# Patient Record
Sex: Male | Born: 2000 | Race: Black or African American | Hispanic: No | Marital: Single | Smoking: Never smoker
Health system: Southern US, Community
[De-identification: ages and names within clinical notes are randomized; demographics above are authoritative.]

## PROBLEM LIST (undated history)

## (undated) VITALS — BP 111/69 | HR 85 | Temp 98.2°F | Resp 16 | Ht 69.0 in | Wt 133.0 lb

## (undated) DIAGNOSIS — J45909 Unspecified asthma, uncomplicated: Secondary | ICD-10-CM

## (undated) DIAGNOSIS — F419 Anxiety disorder, unspecified: Secondary | ICD-10-CM

## (undated) DIAGNOSIS — F319 Bipolar disorder, unspecified: Secondary | ICD-10-CM

---
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
## 2018-01-12 ENCOUNTER — Ambulatory Visit: Payer: Self-pay | Admitting: Psychiatry

## 2018-01-31 ENCOUNTER — Ambulatory Visit (INDEPENDENT_AMBULATORY_CARE_PROVIDER_SITE_OTHER): Payer: Self-pay | Admitting: Psychiatry

## 2018-01-31 ENCOUNTER — Encounter: Payer: Self-pay | Admitting: Psychiatry

## 2018-01-31 DIAGNOSIS — F902 Attention-deficit hyperactivity disorder, combined type: Secondary | ICD-10-CM

## 2018-01-31 NOTE — Progress Notes (Signed)
      [facility] Counselor/Therapist Progress Note  Patient ID: Kevin Shepherd, MRN: [id number],    Date: 01/31/2018  Time Spent: 48 minutes   Treatment Type: Individual Therapy  Reported Symptoms: Anxious Mood,sadness over the past month, impulsive choice  Mental Status Exam:  Appearance:   Casual     Behavior:  Appropriate  Motor:  Normal  Speech/Language:   Normal Rate  Affect:  Appropriate  Mood:  normal  Thought process:  circumstantial  Thought content:    WNL  Sensory/Perceptual disturbances:    WNL  Orientation:  oriented to person, place and situation  Attention:  Good  Concentration:  Good  Memory:  WNL  Fund of knowledge:   Good  Insight:    Good  Judgment:   Good  Impulse Control:  Good   Risk Assessment: Danger to Self:  No Self-injurious Behavior: No Danger to Others: No Duty to Warn:no Physical Aggression / Violence:No  Access to Firearms a concern: No  Gang Involvement:No   Subjective: Patient was present for session.  He reported he was fired from his job after an impulsive decision to help someone.  He went on to report that he had a few weeks of depression but he than used his self talk to get himself going.  He has found another job and is excited about the new option.  Pt went on to explain that he is trying to remind himself that even when things look bad in the moment they can still be okay.  Discussed the importance of perceptions and using his CBT skills to remind himself of what he needs to focus on on a regular basis.  Also discussed skills to think through choices and the outcomes of those choices before he makes decisions.  Patient was able to report some positive choices he has made recently and ways he is taking better care of himself and moving in a positive direction.  Agreed to have patient return in 6-8 weeks due to the progress and starting a new position.  Interventions: Solution-Oriented/Positive Psychology  Diagnosis:   ICD-10-CM   1.  Attention deficit hyperactivity disorder (ADHD), combined type F90.2     Plan: 1.  Patient to continue to engage in individual counseling 2-4 times a month or as needed. 2.  Patient to identify and apply CBT, coping skills learned in session to decrease impulsive behaviors. 3.  Patient to contact this office, go to the local ED or call 911 if a crisis or emergency develops between visits.  Stevphen MeuseHolly Dawsen Krieger, [place]PC

## 2018-03-16 ENCOUNTER — Ambulatory Visit: Payer: Self-pay | Admitting: Psychiatry

## 2018-04-02 ENCOUNTER — Ambulatory Visit: Payer: Self-pay | Admitting: Psychiatry

## 2019-05-27 ENCOUNTER — Other Ambulatory Visit: Payer: Self-pay

## 2019-05-27 ENCOUNTER — Encounter: Payer: Self-pay | Admitting: Psychiatry

## 2019-05-27 ENCOUNTER — Ambulatory Visit (INDEPENDENT_AMBULATORY_CARE_PROVIDER_SITE_OTHER): Payer: BC Managed Care – PPO | Admitting: Psychiatry

## 2019-05-27 VITALS — Ht 68.0 in | Wt 141.0 lb

## 2019-05-27 DIAGNOSIS — F411 Generalized anxiety disorder: Secondary | ICD-10-CM | POA: Diagnosis not present

## 2019-05-27 DIAGNOSIS — F341 Dysthymic disorder: Secondary | ICD-10-CM

## 2019-05-27 DIAGNOSIS — F902 Attention-deficit hyperactivity disorder, combined type: Secondary | ICD-10-CM | POA: Diagnosis not present

## 2019-05-27 MED ORDER — DESVENLAFAXINE SUCCINATE ER 25 MG PO TB24
25.0000 mg | ORAL_TABLET | Freq: Every day | ORAL | 2 refills | Status: DC
Start: 2019-05-27 — End: 2019-06-16

## 2019-05-27 NOTE — Progress Notes (Signed)
[facility] Med Check  Patient ID: Kevin Shepherd,  MRN: [id number]  PCP: Patient, No Pcp Per  Date of Evaluation: 05/27/2019 Time spent:20 minutes from 1120 to 1140  Chief Complaint:  Chief Complaint    Depression; ADHD; Anxiety      HISTORY/CURRENT STATUS: Kevin Shepherd is seen onsite in office individually face-to-face and then conjointly in the lobby with grandmother who he then directs to talk to me alone after first forbidding such for total of 20 minutes with lability of consent with epic collateral for psychiatric interview and exam in 42-month evaluation and management of dysthymia, anxiety, and ADHD defended with cluster B traits.  The family requires patient to restart antidepressant antianxiety medication again after having renewed Cymbalta and trazodone at last appointment which was 15 months after the preceding appointment.  He had taken Lamictal, Depakote, and Intuniv from Dr. Tomasa Shepherd for 6 years prior to being seen the couple of times by myself after Dr. Tomasa Shepherd departed the practice, stepfather also being seen by myself while mother elsewhere in the office.  Family changes in employment and residence over time become highin expressed emotion, patient summarizing today that he has his own apartment and ability to work and needs no assistance or assessment for wellbeing or efficacy.  In reviewing past treatment targets and attempting to integrate with interim relationship and environmental factors, the patient dismisses the past history of maternal grandfather having OCD, maternal uncle having schizophrenia, and father having cannabis with anger problems.  He particularly feels these are family conflicts about history especially about his father seeming to disapprove of his stepfather and mother now.  Grandmother suggests she is caught in the middle but that Kevin Shepherd no longer resides with her.  However, she suggests that he makes depressive and anxious statements that concern her as he is  angry about daily life and what will happen next.  She currently considers these threatening statements but is not more specific stating that she understands Kevin Shepherd is private about these affairs, and he instructs me that he will discuss these in therapy but not with me.  He does seem to have some comfort in his psychotherapy with Kevin Shepherd last being seen in November 2019 around the ADHD not returning for follow-up in 2 weeks.  Weight is stable and there are no general medical concerns now including GI symptoms of the past.  Patient doubts he needs any medication while grandmother considers that he must have medication to relieve the anxiety and depression and be favorable to his focus.  They conclude that I send this to the pharmacy at [facility] [address] but patient is doubtful he will take it but he does seem to ultimately respect and listen to grandmother in the lobby.  He seems angry about past conclusions of the family at large with blended family conflicts.  He does not tolerate change and does not acknowledge the status of his cannabis.  He does not acknowledge mania, suicidality, psychosis, or delirium.  Depression      The patient presents with depression.  This is a chronic problem.  The current episode started more than 1 year ago.   The onset quality is gradual.   The problem occurs daily.  The problem has been waxing and waning since onset.  Associated symptoms include decreased concentration, hopelessness, irritable, restlessness, decreased interest and sad.  Associated symptoms include no fatigue, no helplessness, does not have insomnia, no appetite change and no indigestion.     The symptoms are aggravated by  work stress, social issues and family issues.  Past treatments include SNRIs - Serotonin and norepinephrine reuptake inhibitors, other medications and psychotherapy.  Compliance with treatment is poor.  Past compliance problems include difficulty with treatment plan and medication  issues.  Previous treatment provided mild relief.  Risk factors include substance abuse, stress, major life event, history of mental illness, family history of mental illness, family history and a change in medication usage/dosage.   Past medical history includes anxiety, depression and mental health disorder.     Pertinent negatives include no life-threatening condition, no physical disability, no recent psychiatric admission, no eating disorder and no obsessive-compulsive disorder.   Individual Medical History/ Review of Systems: Changes? :No   Allergies: Patient has no known allergies.  Current Medications:  Current Outpatient Medications:  .  benzonatate (TESSALON) 100 MG capsule, Take 1 capsule (100 mg total) by mouth every 8 (eight) hours., Disp: 21 capsule, Rfl: 0 .  desvenlafaxine 25 MG TB24, Take 25 mg by mouth daily after breakfast., Disp: 30 tablet, Rfl: 2 .  ibuprofen (ADVIL,MOTRIN) 800 MG tablet, Take 1 tablet (800 mg total) by mouth 3 (three) times daily., Disp: 21 tablet, Rfl: 0 .  ondansetron (ZOFRAN ODT) 4 MG disintegrating tablet, Take 1 tablet (4 mg total) by mouth every 8 (eight) hours as needed for nausea., Disp: 10 tablet, Rfl: 0  Medication Side Effects: none  Family Medical/ Social History: Changes? No  MENTAL HEALTH EXAM:  Height 5\' 8"  (1.727 m), weight 141 lb (64 kg).Body mass index is 21.44 kg/m. Muscle strengths and tone 5/5, postural reflexes and gait 0/0, and AIMS = 0 otherwise deferred for coronavirus shutdown  General Appearance: Casual, Fairly Groomed, Guarded and Meticulous  Eye Contact:  Fair  Speech:  Blocked, Clear and Coherent, Normal Rate and Talkative  Volume:  Normal  Mood:  Angry, Anxious, Depressed, Dysphoric and Irritable  Affect:  Depressed, Inappropriate, Labile, Restricted and Anxious  Thought Process:  Coherent, Goal Directed, Irrelevant, Linear and Descriptions of Associations: Tangential  Orientation:  Full (Time, Place, and Person)   Thought Content: Rumination and Tangential   Suicidal Thoughts:  No grandmother reporting patient makes angry threatening statements not being more specific  Homicidal Thoughts:  No grandmother reporting patient makes angry threatening statements not being more specific  Memory:  Immediate;   Good and Fair Remote;   Good and Fair  Judgement:  Fair to impaired  Insight:  Fair and Lacking  Psychomotor Activity:  Normal, Increased, Mannerisms and Restlessness  Concentration:  Concentration: Fair and Attention Span: Fair  Recall:  [employer] of Knowledge: Fair  Language: Good  Assets:  Housing Resilience Talents/Skills  ADL's:  Intact  Cognition: WNL  Prognosis:  Fair    DIAGNOSES:    ICD-10-CM   1. Persistent depressive disorder with atypical features, currently moderate  F34.1 desvenlafaxine 25 MG TB24  2. Attention deficit hyperactivity disorder (ADHD), combined type, mild  F90.2 desvenlafaxine 25 MG TB24  3. Generalized anxiety disorder  F41.1 desvenlafaxine 25 MG TB24    Receiving Psychotherapy: Yes Patient states he is seeing Lina Sayre, [facility] today and that family required him to see me at the same time because of his past treatment needs and current status   RECOMMENDATIONS: The patient dismisses restarting any of his past medication despite his anger over her current relations and communication, as he expresses his own commitment to resumption of consistent appropriate therapy participation.  We discussed facilitation of his participation for consistency, focus, and  impulse and emotion regulation with Pristiq as an alternative to previous medications.  He instructs that I am not include grandmother initially and then when they call me to the lobby instructs that I do speak to her alone which time grandmother focuses simply upon getting a medication for the patient's depression and anxiety which she describes as the source of his worsening of focus communication and problem  solving for current conflicts.  He is E scribed Pristiq 25 mg every morning after breakfast sent as #30 with [facility] [address] reviewing with patient and then mother the medication for his past experience and knowledge about his medications and problems.  Recommendation for return in 3 months or sooner if willing can be integrated as he prepares to see Kevin Shepherd, [facility] shortly thereafter this morning to which he seems to look forward as though he is more comfortable discussing his problems with her.   Chauncey Mann, MD

## 2019-05-28 ENCOUNTER — Ambulatory Visit (INDEPENDENT_AMBULATORY_CARE_PROVIDER_SITE_OTHER): Payer: BC Managed Care – PPO | Admitting: Psychiatry

## 2019-05-28 DIAGNOSIS — F341 Dysthymic disorder: Secondary | ICD-10-CM | POA: Diagnosis not present
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
## 2019-06-12 ENCOUNTER — Encounter (HOSPITAL_COMMUNITY): Payer: Self-pay | Admitting: Psychiatric/Mental Health

## 2019-06-12 ENCOUNTER — Inpatient Hospital Stay (HOSPITAL_COMMUNITY)
Admission: AD | Admit: 2019-06-12 | Discharge: 2019-06-16 | DRG: 885 | Disposition: A | Discharge status: Home / Self Care | Payer: BC Managed Care – PPO | Attending: Emergency Medicine | Admitting: Emergency Medicine

## 2019-06-12 ENCOUNTER — Other Ambulatory Visit: Payer: Self-pay

## 2019-06-12 DIAGNOSIS — F319 Bipolar disorder, unspecified: Secondary | ICD-10-CM

## 2019-06-12 DIAGNOSIS — Z818 Family history of other mental and behavioral disorders: Secondary | ICD-10-CM

## 2019-06-12 DIAGNOSIS — S62306A Unspecified fracture of fifth metacarpal bone, right hand, initial encounter for closed fracture: Secondary | ICD-10-CM

## 2019-06-12 DIAGNOSIS — Z20822 Contact with and (suspected) exposure to covid-19: Secondary | ICD-10-CM | POA: Diagnosis present

## 2019-06-12 DIAGNOSIS — F3164 Bipolar disorder, current episode mixed, severe, with psychotic features: Principal | ICD-10-CM | POA: Diagnosis present

## 2019-06-12 DIAGNOSIS — S62308A Unspecified fracture of other metacarpal bone, initial encounter for closed fracture: Secondary | ICD-10-CM | POA: Diagnosis present

## 2019-06-12 DIAGNOSIS — T1490XA Injury, unspecified, initial encounter: Secondary | ICD-10-CM | POA: Diagnosis not present

## 2019-06-12 DIAGNOSIS — R45851 Suicidal ideations: Secondary | ICD-10-CM | POA: Diagnosis present

## 2019-06-12 DIAGNOSIS — Z9114 Patient's other noncompliance with medication regimen: Secondary | ICD-10-CM

## 2019-06-12 DIAGNOSIS — W2201XA Walked into wall, initial encounter: Secondary | ICD-10-CM | POA: Diagnosis present

## 2019-06-12 DIAGNOSIS — F1290 Cannabis use, unspecified, uncomplicated: Secondary | ICD-10-CM | POA: Diagnosis present

## 2019-06-12 LAB — RESPIRATORY PANEL BY RT PCR (FLU A&B, COVID)
Influenza A by PCR: NEGATIVE
Influenza B by PCR: NEGATIVE
SARS Coronavirus 2 by RT PCR: NEGATIVE

## 2019-06-12 MED ORDER — ALUM & MAG HYDROXIDE-SIMETH 200-200-20 MG/5ML PO SUSP
30.0000 mL | ORAL | Status: DC | PRN
Start: 2019-06-12 — End: 2019-06-16

## 2019-06-12 MED ORDER — OLANZAPINE 5 MG PO TBDP: 5.0000 mg | ORAL_TABLET | Freq: Three times a day (TID) | ORAL | Status: DC | PRN

## 2019-06-12 MED ORDER — LORAZEPAM 1 MG PO TABS: 1.0000 mg | ORAL_TABLET | ORAL | Status: DC | PRN

## 2019-06-12 MED ORDER — HYDROXYZINE HCL 25 MG PO TABS: 25.0000 mg | ORAL_TABLET | Freq: Three times a day (TID) | ORAL | Status: DC | PRN

## 2019-06-12 MED ORDER — MAGNESIUM HYDROXIDE 400 MG/5ML PO SUSP
30.0000 mL | Freq: Every day | ORAL | Status: DC | PRN
Start: 2019-06-12 — End: 2019-06-16

## 2019-06-12 MED ORDER — ZIPRASIDONE MESYLATE 20 MG IM SOLR: 20.0000 mg | INTRAMUSCULAR | Status: DC | PRN

## 2019-06-12 MED ORDER — LORAZEPAM 2 MG/ML IJ SOLN
INTRAMUSCULAR | Status: AC
  Filled 2019-06-12: qty 1

## 2019-06-12 MED ORDER — TRAZODONE HCL 50 MG PO TABS: 50.0000 mg | ORAL_TABLET | Freq: Every evening | ORAL | Status: DC | PRN

## 2019-06-12 MED ORDER — LORAZEPAM 2 MG/ML IJ SOLN: 1.0000 mg | Freq: Four times a day (QID) | INTRAMUSCULAR | Status: DC | PRN

## 2019-06-12 MED ORDER — LORAZEPAM 1 MG PO TABS: 1.0000 mg | ORAL_TABLET | Freq: Four times a day (QID) | ORAL | Status: DC | PRN

## 2019-06-12 MED ORDER — ZIPRASIDONE MESYLATE 20 MG IM SOLR
INTRAMUSCULAR | Status: AC
  Filled 2019-06-12: qty 20

## 2019-06-12 MED ORDER — ACETAMINOPHEN 325 MG PO TABS
650.0000 mg | ORAL_TABLET | Freq: Four times a day (QID) | ORAL | Status: DC | PRN
  Administered 2019-06-14 – 2019-06-15 (×2): 650 mg via ORAL
  Filled 2019-06-12 (×2): qty 2
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
## 2019-06-12 NOTE — Progress Notes (Signed)
D:  Patient presents with increased agitation due to being IVC'ed by family. Pt states, "I hate my mother and my step-father. I don't want her knowing anything about my care here. I'll let my father know". He reports "fair" appetite, "poor" sleep , and denies any physical complaints when asked. He denies any SI, HI, AVH when asked. Compliant with COVID testing after encouragement. Results pending. No aggressive behaviors noted. Denies SI and passive HI toward mother and step-father. Pt states, "If they come near me, ain't no telling what I might do". Patient easily agitated.    A: Support and encouragement provided. Routine safety checks conducted every 15 minutes per unit protocol. Encouraged to notify if thoughts of harm toward self or others arise. He agrees.   R: Patient remains safe at this time; ate meal without difficulty; patient verbally contracts for safety at this time. Will continue to monitor.
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
## 2019-06-12 NOTE — Progress Notes (Signed)
  D: Patient lying in bed at this time. Denies having any issues. Some irritation noted from patient when MHT does q 15 minute checks. Spoke with him and told him that we have to do this and offered him something for sleep if needed. He reports "I'm okay".  A: Staff will continue to monitor on q 15 minute checks and follow treatment plan as needed. R: Continues to stay in bed.

## 2019-06-13 ENCOUNTER — Telehealth: Payer: Self-pay | Admitting: Psychiatry

## 2019-06-13 DIAGNOSIS — R45851 Suicidal ideations: Secondary | ICD-10-CM | POA: Diagnosis present

## 2019-06-13 DIAGNOSIS — W2201XA Walked into wall, initial encounter: Secondary | ICD-10-CM | POA: Diagnosis present

## 2019-06-13 DIAGNOSIS — F1290 Cannabis use, unspecified, uncomplicated: Secondary | ICD-10-CM | POA: Diagnosis present

## 2019-06-13 DIAGNOSIS — F3164 Bipolar disorder, current episode mixed, severe, with psychotic features: Secondary | ICD-10-CM | POA: Diagnosis present

## 2019-06-13 DIAGNOSIS — S62308A Unspecified fracture of other metacarpal bone, initial encounter for closed fracture: Secondary | ICD-10-CM | POA: Diagnosis present

## 2019-06-13 DIAGNOSIS — Z818 Family history of other mental and behavioral disorders: Secondary | ICD-10-CM | POA: Diagnosis not present

## 2019-06-13 DIAGNOSIS — T1490XA Injury, unspecified, initial encounter: Secondary | ICD-10-CM | POA: Diagnosis present

## 2019-06-13 DIAGNOSIS — Z20822 Contact with and (suspected) exposure to covid-19: Secondary | ICD-10-CM | POA: Diagnosis present

## 2019-06-13 DIAGNOSIS — Z9114 Patient's other noncompliance with medication regimen: Secondary | ICD-10-CM | POA: Diagnosis not present

## 2019-06-13 LAB — COMPREHENSIVE METABOLIC PANEL
ALT: 46 U/L — ABNORMAL HIGH (ref 0–44)
AST: 33 U/L (ref 15–41)
Albumin: 4.7 g/dL (ref 3.5–5.0)
Alkaline Phosphatase: 80 U/L (ref 38–126)
Anion gap: 10 (ref 5–15)
BUN: 11 mg/dL (ref 6–20)
CO2: 26 mmol/L (ref 22–32)
Calcium: 9.6 mg/dL (ref 8.9–10.3)
Chloride: 102 mmol/L (ref 98–111)
Creatinine, Ser: 0.9 mg/dL (ref 0.61–1.24)
GFR calc Af Amer: >60 mL/min (ref >60)
GFR calc non Af Amer: >60 mL/min (ref >60)
Glucose, Bld: 94 mg/dL (ref 70–99)
Potassium: 3.6 mmol/L (ref 3.5–5.1)
Sodium: 138 mmol/L (ref 135–145)
Total Bilirubin: 1 mg/dL (ref 0.3–1.2)
Total Protein: 8.3 g/dL — ABNORMAL HIGH (ref 6.5–8.1)

## 2019-06-13 LAB — ETHANOL: Alcohol, Ethyl (B): <10 mg/dL (ref <10)

## 2019-06-13 LAB — URINALYSIS, ROUTINE W REFLEX MICROSCOPIC
Bilirubin Urine: NEGATIVE
Glucose, UA: NEGATIVE mg/dL
Hgb urine dipstick: NEGATIVE
Ketones, ur: 20 mg/dL — AB
Leukocytes,Ua: NEGATIVE
Nitrite: NEGATIVE
Protein, ur: 30 mg/dL — AB
Specific Gravity, Urine: 1.028 (ref 1.005–1.030)
pH: 5 (ref 5.0–8.0)

## 2019-06-13 LAB — CBC
HCT: 47.9 % (ref 39.0–52.0)
Hemoglobin: 15.4 g/dL (ref 13.0–17.0)
MCH: 28.1 pg (ref 26.0–34.0)
MCHC: 32.2 g/dL (ref 30.0–36.0)
MCV: 87.4 fL (ref 80.0–100.0)
Platelets: 281 10*3/uL (ref 150–400)
RBC: 5.48 MIL/uL (ref 4.22–5.81)
RDW: 13.7 % (ref 11.5–15.5)
WBC: 4.9 10*3/uL (ref 4.0–10.5)
nRBC: 0 % (ref 0.0–0.2)

## 2019-06-13 LAB — LIPID PANEL
Cholesterol: 188 mg/dL (ref 0–200)
HDL: 57 mg/dL (ref >40)
LDL Cholesterol: 123 mg/dL — ABNORMAL HIGH (ref 0–99)
Total CHOL/HDL Ratio: 3.3 RATIO
Triglycerides: 40 mg/dL (ref <150)
VLDL: 8 mg/dL (ref 0–40)

## 2019-06-13 LAB — RAPID URINE DRUG SCREEN, HOSP PERFORMED
Amphetamines: NOT DETECTED
Barbiturates: NOT DETECTED
Benzodiazepines: NOT DETECTED
Cocaine: NOT DETECTED
Opiates: NOT DETECTED
Tetrahydrocannabinol: POSITIVE — AB

## 2019-06-13 LAB — TSH: TSH: 1.403 u[IU]/mL (ref 0.350–4.500)

## 2019-06-13 MED ORDER — TRAZODONE HCL 100 MG PO TABS
200.0000 mg | ORAL_TABLET | Freq: Every day | ORAL | Status: DC
  Filled 2019-06-13 (×6): qty 2

## 2019-06-13 MED ORDER — OLANZAPINE 10 MG PO TBDP
10.0000 mg | ORAL_TABLET | Freq: Every day | ORAL | Status: DC
  Filled 2019-06-13 (×4): qty 1

## 2019-06-13 MED ORDER — OXCARBAZEPINE 150 MG PO TABS
75.0000 mg | ORAL_TABLET | Freq: Two times a day (BID) | ORAL | Status: DC
  Administered 2019-06-15 – 2019-06-16 (×2): 75 mg via ORAL
  Filled 2019-06-13 (×10): qty 0.5

## 2019-06-13 MED ORDER — VENLAFAXINE HCL ER 75 MG PO CP24
75.0000 mg | ORAL_CAPSULE | Freq: Every day | ORAL | Status: DC
  Filled 2019-06-13 (×2): qty 1

## 2019-06-13 MED ORDER — OLANZAPINE 10 MG PO TBDP
10.0000 mg | ORAL_TABLET | Freq: Two times a day (BID) | ORAL | Status: DC
  Filled 2019-06-13 (×2): qty 1
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
## 2019-06-13 NOTE — Progress Notes (Signed)
[facility] Group Notes:  (Nursing/MHT/Case Management/Adjunct)  Date:  06/13/2019  Time:  2030  Type of Therapy:  wrap up group  Participation Level:  Active  Participation Quality:  Appropriate, Attentive, Sharing and Supportive  Affect:  Anxious  Cognitive:  Alert  Insight:  Improving  Engagement in Group:  Engaged  Modes of Intervention:  Clarification, Education and Support  Summary of Progress/Problems: Positive thinking and positive change were discussed. Pt reported learning "true colors" of some people in his life and wants to be more independent by earning more money. Pt is grateful for his dog.   Marcille Buffy 06/13/2019, 9:01 PM

## 2019-06-13 NOTE — BH Assessment (Signed)
BHH Assessment Progress Note  Per Malvin Johns, MD, this pt requires psychiatric hospitalization.  Jasmone has assigned pt to [facility] Rm 300-2.  Pt presents under IVC initiated by pt's mother, and upheld by Dr Jeannine Kitten.  IVC documents may be found on pt's chart.  Pt's nurse has been notified.   Doylene Canning, [place] Behavioral Health Coordinator [phone]

## 2019-06-13 NOTE — Progress Notes (Signed)
D:  Patient presents agitated this morning, requesting to leave. Denies need for inpatient stay. Pt continue blaming mother and Aunt for reason for being here. Pt states, "My Aunt stay in [place]. She don't know what's really going on down here. My mother know but staying quiet about it. That's why I don't want her in my life anymore. Patient was agitated, but cooperated, when staff transported him to [facility] for continued treatment. Denies any SI, HI, AVH when asked.    A: Support and encouragement provided. Routine safety checks conducted every 15 minutes per unit protocol. Encouraged to notify staff if thoughts of harm toward self or others arise. He agrees.   R: Patient remains safe at this time, patient verbally contracts for safety at this time. Will continue to monitor.   [place] NOVEL CORONAVIRUS (COVID-19) DAILY CHECK-OFF SYMPTOMS - answer yes or no to each - every day NO YES  Have you had a fever in the past 24 hours?  . Fever (Temp > 37.80C / 100F) X   Have you had any of these symptoms in the past 24 hours? . New Cough .  Sore Throat  .  Shortness of Breath .  Difficulty Breathing .  Unexplained Body Aches   X   Have you had any one of these symptoms in the past 24 hours not related to allergies?   . Runny Nose .  Nasal Congestion .  Sneezing   X   If you have had runny nose, nasal congestion, sneezing in the past 24 hours, has it worsened?  X   EXPOSURES - check yes or no X   Have you traveled outside the state in the past 14 days?  X   Have you been in contact with someone with a confirmed diagnosis of COVID-19 or PUI in the past 14 days without wearing appropriate PPE?  X   Have you been living in the same home as a person with confirmed diagnosis of COVID-19 or a PUI (household contact)?    X   Have you been diagnosed with COVID-19?    X              What to do next: Answered NO to all: Answered YES to anything:   Proceed with unit schedule Follow the [facility]  Inpatient Flowsheet.
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
## 2019-06-13 NOTE — Progress Notes (Signed)
Pt very irritable and argumentative on approach, pt was observed having a wrist brace on , so and order was put in. Pt did not want to talk to Clinical research associate.    06/13/19 2000  Psych Admission Type (Psych Patients Only)  Admission Status Involuntary  Psychosocial Assessment  Patient Complaints Anxiety;Agitation  Eye Contact Brief  Facial Expression Flat  Affect Constricted  Speech Unremarkable  Interaction Guarded  Motor Activity Fidgety  Appearance/Hygiene Body odor  Behavior Characteristics Anxious;Agitated;Agressive verbally  Mood Labile;Suspicious;Irritable  Thought Process  Coherency Disorganized  Content Blaming others;Paranoia  Delusions Paranoid  Perception WDL  Hallucination None reported or observed  Judgment Limited  Confusion Mild  Danger to Self  Current suicidal ideation? Denies  Danger to Others  Danger to Others Reported or observed  Danger to Others Abnormal  Harmful Behavior to others No threats or harm toward other people  Destructive Behavior No threats or harm toward property

## 2019-06-13 NOTE — Tx Team (Signed)
Initial Treatment Plan 06/13/2019 3:52 PM Kevin Shepherd LGS:[id number]    PATIENT STRESSORS: Financial difficulties Health problems Medication change or noncompliance   PATIENT STRENGTHS: Average or above average intelligence Supportive family/friends   PATIENT IDENTIFIED PROBLEMS: Medication noncompliance  Anxiety  Psychosis                 DISCHARGE CRITERIA:  Adequate post-discharge living arrangements Safe-care adequate arrangements made  PRELIMINARY DISCHARGE PLAN: Attend aftercare/continuing care group Outpatient therapy Return to previous living arrangement  PATIENT/FAMILY INVOLVEMENT: This treatment plan has been presented to and reviewed with the patient, Kevin Shepherd, and/or family member.  The patient and family have been given the opportunity to ask questions and make suggestions.  Clarene Critchley, RN 06/13/2019, 3:52 PM

## 2019-06-14 ENCOUNTER — Inpatient Hospital Stay (HOSPITAL_COMMUNITY)
Admit: 2019-06-14 | Discharge: 2019-06-14 | Disposition: A | Discharge status: Home / Self Care | Payer: BC Managed Care – PPO | Attending: Psychiatry | Admitting: Psychiatry

## 2019-06-14 DIAGNOSIS — F3164 Bipolar disorder, current episode mixed, severe, with psychotic features: Principal | ICD-10-CM

## 2019-06-14 LAB — PROLACTIN: Prolactin: 15.4 ng/mL — ABNORMAL HIGH (ref 4.0–15.2)

## 2019-06-14 LAB — HEMOGLOBIN A1C
Hgb A1c MFr Bld: 5.2 % (ref 4.8–5.6)
Mean Plasma Glucose: 103 mg/dL

## 2019-06-14 MED ORDER — TRAMADOL HCL 50 MG PO TABS: 50.0000 mg | ORAL_TABLET | Freq: Four times a day (QID) | ORAL | Status: DC | PRN

## 2019-06-14 MED ORDER — TETANUS-DIPHTH-ACELL PERTUSSIS 5-2.5-18.5 LF-MCG/0.5 IM SUSP
0.5000 mL | Freq: Once | INTRAMUSCULAR | Status: DC
  Filled 2019-06-14: qty 0.5

## 2019-06-14 NOTE — Progress Notes (Signed)
Orthopedic Tech Progress Note Patient Details:  Kevin Shepherd 2000/09/14 [id number]  Ortho Devices Type of Ortho Device: Ulna gutter splint, Ace wrap Ortho Device/Splint Location: Right Wrist Ortho Device/Splint Interventions: Ordered, Application   Post Interventions Patient Tolerated: Well Instructions Provided: Adjustment of device, Care of device   Kevin Shepherd 06/14/2019, 2:10 PM

## 2019-06-14 NOTE — Progress Notes (Signed)
[facility] MD Progress Note  06/14/2019 9:54 AM Kevin Shepherd  MRN:  [id number] Subjective: Patient is a 19 year old male with a reported history of bipolar disorder who was placed under involuntary commitment by his mother.  Per the IVC paperwork the patient had not been eating, not sleeping or attending to his hygiene.  He also had apparently become paranoid and agitated to the point of becoming violent.  Objective: Patient is seen and examined.  Patient is a 19 year old male with the above-stated past psychiatric history who is seen in follow-up.  He is much calmer today.  He stated he is "just chilling out".  His main concern this morning is right hand pain.  He apparently hit a wall during the event that took place that led to his involuntary commitment.  He stated that he is having pain as well as swelling in his hands.  He does have some form of hand brace on.  He denied auditory or visual hallucinations.  He denied suicidal or homicidal ideation.  He did not take any medication at all yesterday, he refused what was offered.  He did sleep approximately 5-1/2 hours last night, and his vital signs are stable.  Review of his laboratories revealed a mildly elevated ALT at 46 but otherwise normal electrolytes.  His lipid panel was essentially normal.  His CBC was essentially normal.  Hemoglobin A1c was normal, TSH was normal.  Drug screen was positive for marijuana.  He admitted that he uses marijuana every day, and we discussed the dangers of marijuana with regard to psychosis.  Principal Problem: Bipolar disorder (HCC) Diagnosis: Principal Problem:   Bipolar disorder (HCC)  Total Time spent with patient: 20 minutes  Past Psychiatric History: See admission H&P  Past Medical History: History reviewed. No pertinent past medical history. History reviewed. No pertinent surgical history. Family History: History reviewed. No pertinent family history. Family Psychiatric  History: See admission H&P Social History:   Social History   Substance and Sexual Activity  Alcohol Use No     Social History   Substance and Sexual Activity  Drug Use Not Currently  . Types: Marijuana    Social History   Socioeconomic History  . Marital status: Single    Spouse name: Not on file  . Number of children: Not on file  . Years of education: Not on file  . Highest education level: Not on file  Occupational History  . Not on file  Tobacco Use  . Smoking status: Never Smoker  . Smokeless tobacco: Never Used  Substance and Sexual Activity  . Alcohol use: No  . Drug use: Not Currently    Types: Marijuana  . Sexual activity: Not on file  Other Topics Concern  . Not on file  Social History Narrative  . Not on file   Social Determinants of Health   Financial Resource Strain:   . Difficulty of Paying Living Expenses:   Food Insecurity:   . Worried About Programme researcher, broadcasting/film/video in the Last Year:   . Barista in the Last Year:   Transportation Needs:   . Freight forwarder (Medical):   Marland Kitchen Lack of Transportation (Non-Medical):   Physical Activity:   . Days of Exercise per Week:   . Minutes of Exercise per Session:   Stress:   . Feeling of Stress :   Social Connections:   . Frequency of Communication with Friends and Family:   . Frequency of Social Gatherings with Friends and  Family:   . Attends Religious Services:   . Active Member of Clubs or Organizations:   . Attends Banker Meetings:   Marland Kitchen Marital Status:    Additional Social History:    Pain Medications: pt denies Prescriptions: pt denies Over the Counter: pt denies History of alcohol / drug use?: No history of alcohol / drug abuse                    Sleep: Fair  Appetite:  Fair  Current Medications: Current Facility-Administered Medications  Medication Dose Route Frequency Provider Last Rate Last Admin  . acetaminophen (TYLENOL) tablet 650 mg  650 mg Oral Q6H PRN Aldean Baker, NP      . alum & mag  hydroxide-simeth (MAALOX/MYLANTA) 200-200-20 MG/5ML suspension 30 mL  30 mL Oral Q4H PRN Aldean Baker, NP      . hydrOXYzine (ATARAX/VISTARIL) tablet 25 mg  25 mg Oral TID PRN Aldean Baker, NP      . LORazepam (ATIVAN) tablet 1 mg  1 mg Oral Q6H PRN Aldean Baker, NP       Or  . LORazepam (ATIVAN) injection 1 mg  1 mg Intramuscular Q6H PRN Aldean Baker, NP      . magnesium hydroxide (MILK OF MAGNESIA) suspension 30 mL  30 mL Oral Daily PRN Aldean Baker, NP      . OLANZapine zydis (ZYPREXA) disintegrating tablet 10 mg  10 mg Oral QHS Antonieta Pert, MD      . OLANZapine zydis (ZYPREXA) disintegrating tablet 5 mg  5 mg Oral Q8H PRN Aldean Baker, NP       And  . ziprasidone (GEODON) injection 20 mg  20 mg Intramuscular PRN Aldean Baker, NP      . OXcarbazepine (TRILEPTAL) tablet 75 mg  75 mg Oral BID Antonieta Pert, MD      . traZODone (DESYREL) tablet 200 mg  200 mg Oral QHS Malvin Johns, MD        Lab Results:  Results for orders placed or performed during the hospital encounter of 06/12/19 (from the past 48 hour(s))  Respiratory Panel by RT PCR (Flu A&B, Covid) - Nasopharyngeal Swab     Status: None   Collection Time: 06/12/19  2:19 PM   Specimen: Nasopharyngeal Swab  Result Value Ref Range   SARS Coronavirus 2 by RT PCR NEGATIVE NEGATIVE    Comment: (NOTE) SARS-CoV-2 target nucleic acids are NOT DETECTED. The SARS-CoV-2 RNA is generally detectable in upper respiratoy specimens during the acute phase of infection. The lowest concentration of SARS-CoV-2 viral copies this assay can detect is 131 copies/mL. A negative result does not preclude SARS-Cov-2 infection and should not be used as the sole basis for treatment or other patient management decisions. A negative result may occur with  improper specimen collection/handling, submission of specimen other than nasopharyngeal swab, presence of viral mutation(s) within the areas targeted by this assay, and inadequate  number of viral copies (<131 copies/mL). A negative result must be combined with clinical observations, patient history, and epidemiological information. The expected result is Negative. Fact Sheet for Patients:  https://www.moore.com/ Fact Sheet for Healthcare Providers:  https://www.young.biz/ This test is not yet ap proved or cleared by the [place] FDA and  has been authorized for detection and/or diagnosis of SARS-CoV-2 by FDA under an Emergency Use Authorization (EUA). This EUA will remain  in effect (meaning this test can be used) for  the duration of the COVID-19 declaration under Section 564(b)(1) of the Act, 21 U.S.C. section 360bbb-3(b)(1), unless the authorization is terminated or revoked sooner.    Influenza A by PCR NEGATIVE NEGATIVE   Influenza B by PCR NEGATIVE NEGATIVE    Comment: (NOTE) The Xpert Xpress SARS-CoV-2/FLU/RSV assay is intended as an aid in  the diagnosis of influenza from Nasopharyngeal swab specimens and  should not be used as a sole basis for treatment. Nasal washings and  aspirates are unacceptable for Xpert Xpress SARS-CoV-2/FLU/RSV  testing. Fact Sheet for Patients: https://www.moore.com/ Fact Sheet for Healthcare Providers: https://www.young.biz/ This test is not yet approved or cleared by the [place] FDA and  has been authorized for detection and/or diagnosis of SARS-CoV-2 by  FDA under an Emergency Use Authorization (EUA). This EUA will remain  in effect (meaning this test can be used) for the duration of the  Covid-19 declaration under Section 564(b)(1) of the Act, 21  U.S.C. section 360bbb-3(b)(1), unless the authorization is  terminated or revoked. Performed at [facility], [address], [place], [place] [address]   CBC     Status: None   Collection Time: 06/13/19  7:15 AM  Result Value Ref Range   WBC 4.9 4.0 - 10.5 K/uL   RBC  5.48 4.22 - 5.81 MIL/uL   Hemoglobin 15.4 13.0 - 17.0 g/dL   HCT 08.6 57.8 - 46.9 %   MCV 87.4 80.0 - 100.0 fL   MCH 28.1 26.0 - 34.0 pg   MCHC 32.2 30.0 - 36.0 g/dL   RDW 62.9 52.8 - 41.3 %   Platelets 281 150 - 400 K/uL   nRBC 0.0 0.0 - 0.2 %    Comment: Performed at [facility], [address], [place], [place] [address]  Comprehensive metabolic panel     Status: Abnormal   Collection Time: 06/13/19  7:15 AM  Result Value Ref Range   Sodium 138 135 - 145 mmol/L   Potassium 3.6 3.5 - 5.1 mmol/L   Chloride 102 98 - 111 mmol/L   CO2 26 22 - 32 mmol/L   Glucose, Bld 94 70 - 99 mg/dL    Comment: Glucose reference range applies only to samples taken after fasting for at least 8 hours.   BUN 11 6 - 20 mg/dL   Creatinine, Ser 0.27 0.61 - 1.24 mg/dL   Calcium 9.6 8.9 - 25.3 mg/dL   Total Protein 8.3 (H) 6.5 - 8.1 g/dL   Albumin 4.7 3.5 - 5.0 g/dL   AST 33 15 - 41 U/L   ALT 46 (H) 0 - 44 U/L   Alkaline Phosphatase 80 38 - 126 U/L   Total Bilirubin 1.0 0.3 - 1.2 mg/dL   GFR calc non Af Amer >60 >60 mL/min   GFR calc Af Amer >60 >60 mL/min   Anion gap 10 5 - 15    Comment: Performed at [facility], [address], [place], [place] [address]  Hemoglobin A1c     Status: None   Collection Time: 06/13/19  7:15 AM  Result Value Ref Range   Hgb A1c MFr Bld 5.2 4.8 - 5.6 %    Comment: (NOTE)         Prediabetes: 5.7 - 6.4         Diabetes: >6.4         Glycemic control for adults with diabetes: <7.0    Mean Plasma Glucose 103 mg/dL    Comment: (  NOTE) Performed At: [facility] [place], [place] [id number] Rush Farmer MD [id number]   Ethanol     Status: None   Collection Time: 06/13/19  7:15 AM  Result Value Ref Range   Alcohol, Ethyl (B) <10 <10 mg/dL    Comment: (NOTE) Lowest detectable limit for serum alcohol is 10 mg/dL. For medical purposes only. Performed at [facility], [place] [address], [place], [place] [address]   Lipid panel     Status: Abnormal   Collection Time: 06/13/19  7:15 AM  Result Value Ref Range   Cholesterol 188 0 - 200 mg/dL   Triglycerides 40 <150 mg/dL   HDL 57 >40 mg/dL   Total CHOL/HDL Ratio 3.3 RATIO   VLDL 8 0 - 40 mg/dL   LDL Cholesterol 123 (H) 0 - 99 mg/dL    Comment:        Total Cholesterol/HDL:CHD Risk Coronary Heart Disease Risk Table                     Men   Women  1/2 Average Risk   3.4   3.3  Average Risk       5.0   4.4  2 X Average Risk   9.6   7.1  3 X Average Risk  23.4   11.0        Use the calculated Patient Ratio above and the CHD Risk Table to determine the patient's CHD Risk.        ATP III CLASSIFICATION (LDL):  <100     mg/dL   Optimal  100-129  mg/dL   Near or Above                    Optimal  130-159  mg/dL   Borderline  160-189  mg/dL   High  >190     mg/dL   Very High Performed at [facility] [address] [place], [place] [address]   TSH     Status: None   Collection Time: 06/13/19  7:15 AM  Result Value Ref Range   TSH 1.403 0.350 - 4.500 uIU/mL    Comment: Performed by a 3rd Generation assay with a functional sensitivity of <=0.01 uIU/mL. Performed at [facility], [place] [address] [place], [place] [address]   Prolactin     Status: Abnormal   Collection Time: 06/13/19  7:15 AM  Result Value Ref Range   Prolactin 15.4 (H) 4.0 - 15.2 ng/mL    Comment: (NOTE) Performed At: [facility] [place], [place] [id number] Rush Farmer MD SN:[id number]   Urinalysis, Routine w reflex microscopic     Status: Abnormal   Collection Time: 06/13/19  9:18 AM  Result Value Ref Range   Color, Urine YELLOW YELLOW   APPearance TURBID (A) CLEAR   Specific Gravity, Urine 1.028 1.005 - 1.030   pH 5.0 5.0 - 8.0   Glucose, UA NEGATIVE NEGATIVE mg/dL   Hgb urine dipstick NEGATIVE NEGATIVE   Bilirubin Urine NEGATIVE NEGATIVE   Ketones, ur 20 (A) NEGATIVE  mg/dL   Protein, ur 30 (A) NEGATIVE mg/dL   Nitrite NEGATIVE NEGATIVE   Leukocytes,Ua NEGATIVE NEGATIVE   RBC / HPF 0-5 0 - 5 RBC/hpf   Bacteria, UA FEW (A) NONE SEEN   Mucus PRESENT    Amorphous Crystal PRESENT     Comment: Performed at [facility], [address]  [place], [place] [id number]  Urine rapid drug screen (hosp performed)not at [facility]     Status: Abnormal   Collection Time: 06/13/19  9:18 AM  Result Value Ref Range   Opiates NONE DETECTED NONE DETECTED   Cocaine NONE DETECTED NONE DETECTED   Benzodiazepines NONE DETECTED NONE DETECTED   Amphetamines NONE DETECTED NONE DETECTED   Tetrahydrocannabinol POSITIVE (A) NONE DETECTED   Barbiturates NONE DETECTED NONE DETECTED    Comment: (NOTE) DRUG SCREEN FOR MEDICAL PURPOSES ONLY.  IF CONFIRMATION IS NEEDED FOR ANY PURPOSE, NOTIFY LAB WITHIN 5 DAYS. LOWEST DETECTABLE LIMITS FOR URINE DRUG SCREEN Drug Class                     Cutoff (ng/mL) Amphetamine and metabolites    1000 Barbiturate and metabolites    200 Benzodiazepine                 200 Tricyclics and metabolites     300 Opiates and metabolites        300 Cocaine and metabolites        300 THC                            50 Performed at [facility], [address] [place], [place] [address]     Blood Alcohol level:  Lab Results  Component Value Date   ETH <10 06/13/2019    Metabolic Disorder Labs: Lab Results  Component Value Date   HGBA1C 5.2 06/13/2019   MPG 103 06/13/2019   Lab Results  Component Value Date   PROLACTIN 15.4 (H) 06/13/2019   Lab Results  Component Value Date   CHOL 188 06/13/2019   TRIG 40 06/13/2019   HDL 57 06/13/2019   CHOLHDL 3.3 06/13/2019   VLDL 8 06/13/2019   LDLCALC 123 (H) 06/13/2019    Physical Findings: AIMS: Facial and Oral Movements Muscles of Facial Expression: None, normal Lips and Perioral Area: None, normal Jaw: None, normal Tongue: None, normal,Extremity  Movements Upper (arms, wrists, hands, fingers): None, normal Lower (legs, knees, ankles, toes): None, normal, Trunk Movements Neck, shoulders, hips: None, normal, Overall Severity Severity of abnormal movements (highest score from questions above): None, normal Incapacitation due to abnormal movements: None, normal Patient's awareness of abnormal movements (rate only patient's report): No Awareness, Dental Status Current problems with teeth and/or dentures?: No Does patient usually wear dentures?: No  CIWA:  CIWA-Ar Total: 3 COWS:  COWS Total Score: 1  Musculoskeletal: Strength & Muscle Tone: within normal limits Gait & Station: normal Patient leans: N/A  Psychiatric Specialty Exam: Physical Exam  Nursing note and vitals reviewed. Constitutional: He is oriented to person, place, and time. He appears well-developed and well-nourished.  HENT:  Head: Normocephalic and atraumatic.  Respiratory: Effort normal.  Musculoskeletal:     Right wrist: Swelling and tenderness present. Decreased range of motion.  Neurological: He is alert and oriented to person, place, and time.    Review of Systems  Musculoskeletal: Positive for joint swelling and myalgias.  All other systems reviewed and are negative.   Blood pressure 120/72, pulse 94, temperature 98.4 F (36.9 C), temperature source Oral, resp. rate 14, height 5\' 9"  (1.753 m), weight 60.3 kg, SpO2 100 %.Body mass index is 19.64 kg/m.  General Appearance: Casual  Eye Contact:  Fair  Speech:  Normal Rate  Volume:  Decreased  Mood:  Anxious  Affect:  Congruent  Thought  Process:  Coherent and Descriptions of Associations: Circumstantial  Orientation:  Full (Time, Place, and Person)  Thought Content:  Logical  Suicidal Thoughts:  No  Homicidal Thoughts:  No  Memory:  Immediate;   Fair Recent;   Fair Remote;   Fair  Judgement:  Intact  Insight:  Lacking  Psychomotor Activity:  Normal  Concentration:  Concentration: Fair and  Attention Span: Fair  Recall:  [employer] of Knowledge:  Fair  Language:  Good  Akathisia:  Negative  Handed:  Right  AIMS (if indicated):     Assets:  Desire for Improvement Resilience  ADL's:  Intact  Cognition:  WNL  Sleep:  Number of Hours: 5.75     Treatment Plan Summary: Daily contact with patient to assess and evaluate symptoms and progress in treatment, Medication management and Plan : Patient is seen and examined.  Patient is a 19 year old male with the above-stated past psychiatric history who is seen in follow-up.   Diagnosis: #1 unspecified bipolar disorder versus substance-induced mood disorder, #2 cannabis use disorder versus cannabis dependence  Patient is seen in follow-up.  He is much calmer today.  He is not pressured or as agitated as yesterday.  He has refused all medications at this point.  I will try and talk to him more extensively with regard to what ever is going on at his apartment and his landlord as well as his possible eviction.  He will not let us talk to his mother, but he has given permission to talk to his biological father.  Unfortunately his father lives in [place] and does not have contact with him on a daily basis here.  We did discuss the risk of marijuana especially in the circumstances, and he has limited insight to the role that that may be playing and everything that has occurred.  I will continue to try and get him to take medications, but at this point he is not showing anything that would allow [place] to force medications on him.  I have gone on and done the second commitment paperwork today in case that becomes necessary.  1.  Continue hydroxyzine 25 mg p.o. 3 times daily as needed anxiety. 2.  Continue lorazepam 1 mg IM or p.o. every 6 hours as needed anxiety or agitation. 3.  Continue Zyprexa Zydis 10 mg p.o. nightly for mood stability and sleep. 4.  Continue olanzapine and or Geodon agitation protocol as needed. 5.  Continue Trileptal 75 mg p.o.  twice daily for mood stability and anxiety. 6.  Continue trazodone 200 mg p.o. nightly for sleep. 7.  X-rays of right hand and wrist today to rule out fracture. 8.  Disposition planning-in progress.  Antonieta Pert, MD 06/14/2019, 9:54 AM

## 2019-06-14 NOTE — Progress Notes (Signed)
Recreation Therapy Notes  Date: 4.9.21 Time: 0930 Location: [address] Group Room  Group Topic: Stress Management  Goal Area(s) Addresses:  Patient will identify positive stress management techniques. Patient will identify benefits of using stress management post d/c.  Behavioral Response: Engaged  Intervention: Stress Management  Activity:  Meditation.  LRT played a meditation that focused on being resilient.  Patients were to listen and follow along as meditation played to fully engage.  Education:  Stress Management, Discharge Planning.   Education Outcome: Acknowledges Education  Clinical Observations/Feedback: Pt attended and participated in meditation activity.    Caroll Rancher, LRT/CTRS    Lillia Abed, Roselene Gray A 06/14/2019 11:04 AM

## 2019-06-14 NOTE — Progress Notes (Signed)
Pt stated he was ok" I already talked to the doctor , and he said I don't have to take medication, my life is going great, better everyday" pt has no insight into Tx. Pt continues to believe he does not need any medication    06/14/19 2000  Psych Admission Type (Psych Patients Only)  Admission Status Involuntary  Psychosocial Assessment  Patient Complaints Anxiety  Eye Contact Brief  Facial Expression Flat  Affect Constricted  Speech Unremarkable  Interaction Guarded  Motor Activity Fidgety  Appearance/Hygiene Body odor  Behavior Characteristics Unable to participate  Mood Suspicious;Anxious  Thought Process  Coherency Disorganized  Content Blaming others;Paranoia  Delusions Paranoid  Perception WDL  Hallucination None reported or observed  Judgment Limited  Confusion Mild  Danger to Self  Current suicidal ideation? Denies  Danger to Others  Danger to Others Reported or observed  Danger to Others Abnormal  Harmful Behavior to others No threats or harm toward other people  Destructive Behavior No threats or harm toward property

## 2019-06-14 NOTE — Tx Team (Signed)
Interdisciplinary Treatment and Diagnostic Plan Update  06/14/2019 Time of Session: 9:00am Kevin Shepherd MRN: [id number]  Principal Diagnosis: Bipolar disorder [facility])  Secondary Diagnoses: Principal Problem:   Bipolar disorder ([place])   Current Medications:  Current Facility-Administered Medications  Medication Dose Route Frequency Provider Last Rate Last Admin  . acetaminophen (TYLENOL) tablet 650 mg  650 mg Oral Q6H PRN Connye Burkitt, NP      . alum & mag hydroxide-simeth (MAALOX/MYLANTA) 200-200-20 MG/5ML suspension 30 mL  30 mL Oral Q4H PRN Connye Burkitt, NP      . hydrOXYzine (ATARAX/VISTARIL) tablet 25 mg  25 mg Oral TID PRN Connye Burkitt, NP      . LORazepam (ATIVAN) tablet 1 mg  1 mg Oral Q6H PRN Connye Burkitt, NP       Or  . LORazepam (ATIVAN) injection 1 mg  1 mg Intramuscular Q6H PRN Connye Burkitt, NP      . magnesium hydroxide (MILK OF MAGNESIA) suspension 30 mL  30 mL Oral Daily PRN Connye Burkitt, NP      . OLANZapine zydis (ZYPREXA) disintegrating tablet 10 mg  10 mg Oral QHS Sharma Covert, MD      . OLANZapine zydis (ZYPREXA) disintegrating tablet 5 mg  5 mg Oral Q8H PRN Connye Burkitt, NP       And  . ziprasidone (GEODON) injection 20 mg  20 mg Intramuscular PRN Connye Burkitt, NP      . OXcarbazepine (TRILEPTAL) tablet 75 mg  75 mg Oral BID Sharma Covert, MD      . traZODone (DESYREL) tablet 200 mg  200 mg Oral QHS Johnn Hai, MD       PTA Medications: Medications Prior to Admission  Medication Sig Dispense Refill Last Dose  . benzonatate (TESSALON) 100 MG capsule Take 1 capsule (100 mg total) by mouth every 8 (eight) hours. (Patient not taking: Reported on 06/13/2019) 21 capsule 0 Completed Course at Unknown time  . desvenlafaxine 25 MG TB24 Take 25 mg by mouth daily after breakfast. (Patient not taking: Reported on 06/13/2019) 30 tablet 2 Not Taking at Unknown time  . ibuprofen (ADVIL,MOTRIN) 800 MG tablet Take 1 tablet (800 mg total) by mouth 3 (three)  times daily. (Patient not taking: Reported on 06/13/2019) 21 tablet 0 Completed Course at Unknown time  . ondansetron (ZOFRAN ODT) 4 MG disintegrating tablet Take 1 tablet (4 mg total) by mouth every 8 (eight) hours as needed for nausea. (Patient not taking: Reported on 06/13/2019) 10 tablet 0 Completed Course at Unknown time    Patient Stressors: Financial difficulties Health problems Medication change or noncompliance  Patient Strengths: Average or above average intelligence Supportive family/friends  Treatment Modalities: Medication Management, Group therapy, Case management,  1 to 1 session with clinician, Psychoeducation, Recreational therapy.   Physician Treatment Plan for Primary Diagnosis: Bipolar disorder ([place]) Long Term Goal(s): Improvement in symptoms so as ready for discharge Improvement in symptoms so as ready for discharge   Short Term Goals: Ability to identify changes in lifestyle to reduce recurrence of condition will improve Ability to verbalize feelings will improve Ability to disclose and discuss suicidal ideas Ability to demonstrate self-control will improve Ability to identify and develop effective coping behaviors will improve Ability to maintain clinical measurements within normal limits will improve Compliance with prescribed medications will improve Ability to identify triggers associated with substance abuse/mental health issues will improve Ability to identify changes in lifestyle to reduce recurrence of  condition will improve Ability to verbalize feelings will improve Ability to disclose and discuss suicidal ideas Ability to demonstrate self-control will improve Ability to identify and develop effective coping behaviors will improve Ability to maintain clinical measurements within normal limits will improve Compliance with prescribed medications will improve Ability to identify triggers associated with substance abuse/mental health issues will  improve  Medication Management: Evaluate patient's response, side effects, and tolerance of medication regimen.  Therapeutic Interventions: 1 to 1 sessions, Unit Group sessions and Medication administration.  Evaluation of Outcomes: Progressing  Physician Treatment Plan for Secondary Diagnosis: Principal Problem:   Bipolar disorder (HCC)  Long Term Goal(s): Improvement in symptoms so as ready for discharge Improvement in symptoms so as ready for discharge   Short Term Goals: Ability to identify changes in lifestyle to reduce recurrence of condition will improve Ability to verbalize feelings will improve Ability to disclose and discuss suicidal ideas Ability to demonstrate self-control will improve Ability to identify and develop effective coping behaviors will improve Ability to maintain clinical measurements within normal limits will improve Compliance with prescribed medications will improve Ability to identify triggers associated with substance abuse/mental health issues will improve Ability to identify changes in lifestyle to reduce recurrence of condition will improve Ability to verbalize feelings will improve Ability to disclose and discuss suicidal ideas Ability to demonstrate self-control will improve Ability to identify and develop effective coping behaviors will improve Ability to maintain clinical measurements within normal limits will improve Compliance with prescribed medications will improve Ability to identify triggers associated with substance abuse/mental health issues will improve     Medication Management: Evaluate patient's response, side effects, and tolerance of medication regimen.  Therapeutic Interventions: 1 to 1 sessions, Unit Group sessions and Medication administration.  Evaluation of Outcomes: Progressing   RN Treatment Plan for Primary Diagnosis: Bipolar disorder (HCC) Long Term Goal(s): Knowledge of disease and therapeutic regimen to maintain  health will improve  Short Term Goals: Ability to verbalize feelings will improve, Ability to identify and develop effective coping behaviors will improve and Compliance with prescribed medications will improve  Medication Management: RN will administer medications as ordered by provider, will assess and evaluate patient's response and provide education to patient for prescribed medication. RN will report any adverse and/or side effects to prescribing provider.  Therapeutic Interventions: 1 on 1 counseling sessions, Psychoeducation, Medication administration, Evaluate responses to treatment, Monitor vital signs and CBGs as ordered, Perform/monitor CIWA, COWS, AIMS and Fall Risk screenings as ordered, Perform wound care treatments as ordered.  Evaluation of Outcomes: Progressing   LCSW Treatment Plan for Primary Diagnosis: Bipolar disorder (HCC) Long Term Goal(s): Safe transition to appropriate next level of care at discharge, Engage patient in therapeutic group addressing interpersonal concerns.  Short Term Goals: Engage patient in aftercare planning with referrals and resources, Increase social support, Identify triggers associated with mental health/substance abuse issues and Increase skills for wellness and recovery  Therapeutic Interventions: Assess for all discharge needs, 1 to 1 time with Social worker, Explore available resources and support systems, Assess for adequacy in community support network, Educate family and significant other(s) on suicide prevention, Complete Psychosocial Assessment, Interpersonal group therapy.  Evaluation of Outcomes: Progressing  Progress in Treatment: Attending groups: Yes. Participating in groups: Yes. Taking medication as prescribed: Yes. Toleration medication: Yes. Family/Significant other contact made: No, will contact:  grandmother. Patient understands diagnosis: Yes. Discussing patient identified problems/goals with staff: Yes. Medical problems  stabilized or resolved: Yes. Denies suicidal/homicidal ideation: Yes. Issues/concerns per patient  self-inventory: Yes.  New problem(s) identified: Housing issues.  New Short Term/Long Term Goal(s): medication management for mood stabilization; elimination of SI thoughts; development of comprehensive mental wellness/sobriety plan.  Patient Goals: "Relax."  Discharge Plan or Barriers: Patient recently admitted to unit, CSW assessing for appropriate referrals. Patient has worked with [facility] in the past.  Reason for Continuation of Hospitalization: Anxiety Delusions  Depression Medication stabilization  Estimated Length of Stay: 3-5 days  Attendees: Patient: Kevin Shepherd 06/14/2019 11:06 AM  Physician: Marguerita Merles 06/14/2019 11:06 AM  Nursing:  06/14/2019 11:06 AM  RN Care Manager: 06/14/2019 11:06 AM  Social Worker: Enid Cutter, LCSWA 06/14/2019 11:06 AM  Recreational Therapist:  06/14/2019 11:06 AM  Other:  06/14/2019 11:06 AM  Other:  06/14/2019 11:06 AM  Other: 06/14/2019 11:06 AM    Scribe for Treatment Team: Darreld Mclean, LCSWA 06/14/2019 11:06 AM

## 2019-06-14 NOTE — ED Triage Notes (Signed)
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

## 2019-06-14 NOTE — Discharge Instructions (Signed)
You may alternate taking Tylenol and Ibuprofen as needed for pain control. You may take 400-600 mg of ibuprofen every 6 hours and [phone] mg of Tylenol every 6 hours. Do not exceed 4000 mg of Tylenol daily as this can lead to liver damage. Also, make sure to take Ibuprofen with meals as it can cause an upset stomach. Do not take other NSAIDs while taking Ibuprofen such as (Aleve, Naprosyn, Aspirin, Celebrex, etc) and do not take more than the prescribed dose as this can lead to ulcers and bleeding in your GI tract. You may use warm and cold compresses to help with your symptoms.   You were given information to follow-up with Dr. Merlyn Lot who is a Hydrographic surveyor.  This injury will likely require surgery and you will need to follow-up in the office next week for reassessment.  Please return to the ER sooner if you have any new or worsening symptoms.

## 2019-06-14 NOTE — ED Provider Notes (Signed)
[facility] DEPT Provider Note   CSN: [id number] Arrival date & time: 06/14/19  1241     History Chief Complaint  Patient presents with  . hand injury from [place] is a 19 y.o. male.  HPI   Pt is a 19 y/o male with a h/o bipolar disorder, persistent depressive disorder, GAD, and ADHD who presents to the ED for evaluation of hand fracture.  States that he punched a wall a few days ago and since then has had constant pain that is worse with movement.  He has been wearing a splint that his friend gave him.  He has some numbness on the ulnar side of the hand.  He is currently admitted at behavioral health and had an x-ray completed earlier today of the hand and wrist which showed 5th metacarpal fracture.  He was sent here for further evaluation  History reviewed. No pertinent past medical history.  Patient Active Problem List   Diagnosis Date Noted  . Bipolar disorder ([place]) 06/12/2019  . Persistent depressive disorder with atypical features, currently moderate 05/27/2019  . Attention deficit hyperactivity disorder (ADHD), combined type, mild 05/27/2019  . Generalized anxiety disorder 05/27/2019    History reviewed. No pertinent surgical history.     History reviewed. No pertinent family history.  Social History   Tobacco Use  . Smoking status: Never Smoker  . Smokeless tobacco: Never Used  Substance Use Topics  . Alcohol use: No  . Drug use: Not Currently    Types: Marijuana    Home Medications Prior to Admission medications   Medication Sig Start Date End Date Taking? Authorizing Provider  benzonatate (TESSALON) 100 MG capsule Take 1 capsule (100 mg total) by mouth every 8 (eight) hours. Patient not taking: Reported on 06/13/2019 04/29/16   Larene Pickett, PA-C  desvenlafaxine 25 MG TB24 Take 25 mg by mouth daily after breakfast. Patient not taking: Reported on 06/13/2019 05/27/19   Delight Hoh, MD  ibuprofen  (ADVIL,MOTRIN) 800 MG tablet Take 1 tablet (800 mg total) by mouth 3 (three) times daily. Patient not taking: Reported on 06/13/2019 04/29/16   Larene Pickett, PA-C  ondansetron (ZOFRAN ODT) 4 MG disintegrating tablet Take 1 tablet (4 mg total) by mouth every 8 (eight) hours as needed for nausea. Patient not taking: Reported on 06/13/2019 04/29/16   Larene Pickett, PA-C    Allergies    Patient has no known allergies.  Review of Systems   Review of Systems  Constitutional: Negative for fever.  Musculoskeletal:       Right hand pain  Skin: Positive for wound. Negative for color change.  Neurological: Positive for numbness. Negative for weakness.    Physical Exam Updated Vital Signs BP 123/86 (BP Location: Left Arm)   Pulse 91   Temp 98.2 F (36.8 C) (Oral)   Resp 16   Ht 5\' 9"  (1.753 m)   Wt 60.3 kg   SpO2 100%   BMI 19.64 kg/m   Physical Exam Constitutional:      General: He is not in acute distress.    Appearance: He is well-developed.  Eyes:     Conjunctiva/sclera: Conjunctivae normal.  Cardiovascular:     Rate and Rhythm: Normal rate and regular rhythm.  Pulmonary:     Effort: Pulmonary effort is normal.     Breath sounds: Normal breath sounds.  Musculoskeletal:     Comments: TTP to the fourth and fifth metacarpals  with swelling noted to the dorsum of the hand.  Decreased sensation to the ulnar side of the right hand. Decreased range of motion secondary to pain.  Radial/ulnar pulses intact.  Brisk cap refill distally.   Skin:    General: Skin is warm and dry.  Neurological:     Mental Status: He is alert and oriented to person, place, and time.     ED Results / Procedures / Treatments   Labs (all labs ordered are listed, but only abnormal results are displayed) Labs Reviewed  URINALYSIS, ROUTINE W REFLEX MICROSCOPIC - Abnormal; Notable for the following components:      Result Value   APPearance TURBID (*)    Ketones, ur 20 (*)    Protein, ur 30 (*)     Bacteria, UA FEW (*)    All other components within normal limits  COMPREHENSIVE METABOLIC PANEL - Abnormal; Notable for the following components:   Total Protein 8.3 (*)    ALT 46 (*)    All other components within normal limits  LIPID PANEL - Abnormal; Notable for the following components:   LDL Cholesterol 123 (*)    All other components within normal limits  PROLACTIN - Abnormal; Notable for the following components:   Prolactin 15.4 (*)    All other components within normal limits  RAPID URINE DRUG SCREEN, HOSP PERFORMED - Abnormal; Notable for the following components:   Tetrahydrocannabinol POSITIVE (*)    All other components within normal limits  RESPIRATORY PANEL BY RT PCR (FLU A&B, COVID)  CBC  HEMOGLOBIN A1C  ETHANOL  TSH    EKG None  Radiology DG Wrist 2 Views Right  Result Date: 06/14/2019 CLINICAL DATA:  Punched a wall 3 days ago with wrist pain, initial encounter EXAM: RIGHT WRIST - 2 VIEW COMPARISON:  None. FINDINGS: There is no evidence of fracture or dislocation. There is no evidence of arthropathy or other focal bone abnormality. Soft tissues are unremarkable. IMPRESSION: No acute abnormality noted. Electronically Signed   By: Alcide Clever M.D.   On: 06/14/2019 11:55   DG Hand 2 View Right  Result Date: 06/14/2019 CLINICAL DATA:  Pain after hitting wall EXAM: RIGHT HAND - 2 VIEW COMPARISON:  None. FINDINGS: Frontal and lateral views obtained. There is a comminuted fracture of the distal fifth metacarpal with volar angulation and displacement distally. No other fracture. No dislocation. No appreciable joint space narrowing or erosion. IMPRESSION: Comminuted fracture distal fifth metacarpal with dorsal angulation and displacement distally. No other fracture. No dislocation. No evident arthropathy. These results will be called to the ordering clinician or representative by the Radiologist Assistant, and communication documented in the PACS or [employer].  Electronically Signed   By: Bretta Bang III M.D.   On: 06/14/2019 11:55    Procedures Procedures (including critical care time)  SPLINT APPLICATION Date/Time: 2:07 PM Authorized by: Karrie Meres Consent: Verbal consent obtained. Risks and benefits: risks, benefits and alternatives were discussed Consent given by: patient Splint applied by: orthopedic technician Location details: RUE Splint type: ulnar gutter Post-procedure: The splinted body part was neurovascularly unchanged following the procedure. Patient tolerance: Patient tolerated the procedure well with no immediate complications.   Medications Ordered in ED Medications  acetaminophen (TYLENOL) tablet 650 mg (has no administration in time range)  alum & mag hydroxide-simeth (MAALOX/MYLANTA) 200-200-20 MG/5ML suspension 30 mL (has no administration in time range)  magnesium hydroxide (MILK OF MAGNESIA) suspension 30 mL (has no administration in time range)  hydrOXYzine (ATARAX/VISTARIL) tablet 25 mg (has no administration in time range)  OLANZapine zydis (ZYPREXA) disintegrating tablet 5 mg (has no administration in time range)    And  ziprasidone (GEODON) injection 20 mg (has no administration in time range)  LORazepam (ATIVAN) tablet 1 mg (has no administration in time range)    Or  LORazepam (ATIVAN) injection 1 mg (has no administration in time range)  LORazepam (ATIVAN) 2 MG/ML injection (  Canceled Entry 06/12/19 1539)  ziprasidone (GEODON) 20 MG injection (  Not Given 06/12/19 1539)  traZODone (DESYREL) tablet 200 mg (200 mg Oral Not Given 06/13/19 2134)  OLANZapine zydis (ZYPREXA) disintegrating tablet 10 mg (has no administration in time range)  OXcarbazepine (TRILEPTAL) tablet 75 mg (75 mg Oral Not Given 06/14/19 0845)  Tdap (BOOSTRIX) injection 0.5 mL (has no administration in time range)    ED Course  I have reviewed the triage vital signs and the nursing notes.  Pertinent labs & imaging results that were  available during my care of the patient were reviewed by me and considered in my medical decision making (see chart for details).    MDM Rules/Calculators/A&P                      19 year old male presenting for evaluation of hand injury.  Punched a wall 2 days ago.  Has been admitted at behavioral health and had an x-ray today which showed fifth metacarpal fracture.  1:41 PM CONSULT with Earney Hamburg, PA-C with orthopedics who recommended ulnar gutter splint and have the patient follow-up with Dr. Prentice Docker next week.  He states that the fracture will not need to be reduced in the ED.   Patient placed in ulnar gutter splint.  Tdap was updated given his abrasions over his right hand.  He was advised to follow-up next week and return to the ED for new or worsening symptoms.  Was understanding plan reasons to return.  All Questions answered. patient stable for discharge.  Final Clinical Impression(s) / ED Diagnoses Final diagnoses:  Bipolar disorder, current episode mixed, severe, with psychotic features [facility])  Trauma  Trauma    Rx / DC Orders ED Discharge Orders    None       Karrie Meres, PA-C 06/14/19 1407    Rolan Bucco, MD 06/14/19 [phone]

## 2019-06-14 NOTE — Progress Notes (Signed)
Note: Patient returned from [facility] for orthopedic consult due to fracture of his left fifth metacarpal.  Ace wrap with ulna splint applied to right wrist.  Tylenol 650 mg given for complain of pain with good effect.  Refused 5 pm medication and TDap Vaccine.  Routine safety checks maintained every 15 minutes.  Patient is safe on the unit.  Visible in milieu interacting with peers.

## 2019-06-14 NOTE — Progress Notes (Signed)
Patient ID: Kevin Shepherd, male   DOB: May 21, 2000, 21 y.o.   MRN: [id number]  Patient is seen and examined.  Patient's x-rays came back from his right hand.  It looks like he has a comminuted fracture that is displaced in his fifth metacarpal.  We will send him to the emergency department to either be evaluated by emergency physician or orthopedic surgeon to decide appropriate treatment at that time.  I have discussed this with the patient and he understands that they will evaluate him, and the patient will return to our treatment after that assessment.

## 2019-06-14 NOTE — [facility] Suicide Risk Assessment (Cosign Needed)
[facility] INPATIENT:  Family/Significant Other Suicide Prevention Education  Suicide Prevention Education:  Contact Attempts: with grandmother, Philis Pique [phone] has been identified by the patient as the family member/significant other with whom the patient will be residing, and identified as the person(s) who will aid the patient in the event of a mental health crisis.  With written consent from the patient, two attempts were made to provide suicide prevention education, prior to and/or following the patient's discharge.  We were unsuccessful in providing suicide prevention education.  A suicide education pamphlet was given to the patient to share with family/significant other.  Date and time of first attempt: 4/9 at 11:45am  Date and time of second attempt:  Kevin Shepherd 06/14/2019, 11:48 AM
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
## 2019-06-15 NOTE — [facility] Group Notes (Signed)
Adult Psychoeducational Group Note  Date:  06/15/2019 Time:  12:05 PM  Group Topic/Focus:  Goals Group:   The focus of this group is to help patients establish daily goals to achieve during treatment and discuss how the patient can incorporate goal setting into their daily lives to aide in recovery.  Participation Level:  Active  Participation Quality:  Appropriate  Affect:  Flat  Cognitive:  Oriented  Insight: Improving  Engagement in Group:  Engaged  Modes of Intervention:  Education and Exploration  Additional Comments:  Pt attended the group and stated that he wants to be happy and to deal with his issues. Basically he wants to be able to move forward and not feel stuck.  Vira Blanco A 06/15/2019, 12:05 PM
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
## 2019-06-15 NOTE — Progress Notes (Signed)
D. Pt sitting up in bed reading a magazine upon initial approach- pt is soft spoken, friendly but guarded during interaction- reports that he 'doesn't need medication' and is 'doing fine'. Per pt's self inventory, pt rated his depression, hopelessness and anxiety all 0's. Pt writes that his goal today is "balance" and writes that "positivity and self love" will help him to meet that goal. Pt rates that his right wrist pain is a 2/10 - declining pain medicine. Pt currently denies SI/HI and AVH A. Labs and vitals monitored. Pt supported emotionally and encouraged to express concerns and ask questions.   R. Pt remains safe with 15 minute checks. Will continue POC.
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
## 2019-06-15 NOTE — Progress Notes (Signed)
   06/15/19 2150  Psych Admission Type (Psych Patients Only)  Admission Status Involuntary  Psychosocial Assessment  Patient Complaints None  Eye Contact Brief  Facial Expression Other (Comment) (appropriate, pleasant)  Affect Appropriate to circumstance  Speech Logical/coherent  Interaction Guarded  Motor Activity Other (Comment) (WDL)  Appearance/Hygiene Unremarkable  Behavior Characteristics Appropriate to situation  Mood Pleasant  Thought Process  Coherency WDL  Content WDL  Delusions None reported or observed  Perception WDL  Hallucination None reported or observed  Judgment Limited  Confusion Mild  Danger to Self  Current suicidal ideation? Denies  Danger to Others  Danger to Others None reported or observed  Danger to Others Abnormal  Harmful Behavior to others No threats or harm toward other people  Destructive Behavior No threats or harm toward property

## 2019-06-15 NOTE — Progress Notes (Signed)
[facility] Group Notes:  (Nursing/MHT/Case Management/Adjunct)  Date:  06/15/2019  Time:  2030  Type of Therapy:  wrap up group  Participation Level:  Active  Participation Quality:  Appropriate, Attentive, Sharing and Supportive  Affect:  Appropriate  Cognitive:  Appropriate  Insight:  Good  Engagement in Group:  Engaged  Modes of Intervention:  Clarification, Education and Support  Summary of Progress/Problems: Positive thinking and self-care were discussed.   Kevin Shepherd 06/15/2019, 9:29 PM

## 2019-06-16 MED ORDER — TRAZODONE HCL 100 MG PO TABS: 200.0000 mg | ORAL_TABLET | Freq: Every day | ORAL | 0 refills | Status: AC

## 2019-06-16 MED ORDER — OLANZAPINE 10 MG PO TBDP: 10.0000 mg | ORAL_TABLET | Freq: Every day | ORAL | 0 refills | Status: AC

## 2019-06-16 MED ORDER — HYDROXYZINE HCL 25 MG PO TABS: 25.0000 mg | ORAL_TABLET | Freq: Three times a day (TID) | ORAL | 0 refills | Status: AC | PRN

## 2019-06-16 MED ORDER — OXCARBAZEPINE 150 MG PO TABS: 75.0000 mg | ORAL_TABLET | Freq: Two times a day (BID) | ORAL | 0 refills | Status: AC
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
## 2019-06-16 NOTE — [facility] Suicide Risk Assessment (Signed)
[facility] INPATIENT:  Family/Significant Other Suicide Prevention Education  Suicide Prevention Education:  Education Completed; grandmother, Madie Reno, [phone] ,  (name of family member/significant other) has been identified by the patient as the family member who will aid the patient in the event of a mental health crisis (suicidal ideations/suicide attempt).  With written consent from the patient, the family member/significant other has been provided the following suicide prevention education, prior to the and/or following the discharge of the patient.  Patient's grandmother states she is shocked that patient is being discharged today, especially considering the fact that he has continued to refuse medicines while here.  He has no place to go since he is being evicted from his apartment due to things happening in direct relation to his non-medicated delusional state.  He cannot go to mother's house or grandmother's house because of legal issues in the past related to non-medicated delusions.  CSW provided grandmother with information for the [employer] [facility] [employer] [facility].  She stated that initially they will probably pay for a hotel room.  Patient has not conveyed to his family that he punched a wall and injured his hand.  This information was shared, along with the follow-up for both his wrist and his psychiatric care.  Grandmother is a Engineer, civil (consulting), writes down patient's ordered medications, but also asks if he has been taking the medicine here.  CSW reviewed notes and informed her that he has refused them while at [facility], and she feels that his discharge is a very bad idea.  She is very calm, but states "I'm saddened by this.  If they cannot get him to take his medicine, there is no way we can get him to take it."  She asks if CSW will talk to mother, and is informed that he refuses consent.  She shares that she is afraid of what is going to happen now that he is so angry at  mother for doing the IVC paperwork.  She does ask to talk to doctor, which is conveyed by Epic message to him    The suicide prevention education provided includes the following:  Suicide risk factors  Suicide prevention and interventions  National Suicide Hotline telephone number  [facility] assessment telephone number  [facility] Emergency Assistance 911  [facility] and/or Residential Mobile Crisis Unit telephone number  Request made of family/significant other to:  Remove weapons (e.g., guns, rifles, knives), all items previously/currently identified as safety concern.    Remove drugs/medications (over-the-counter, prescriptions, illicit drugs), all items previously/currently identified as a safety concern.  The family member/significant other verbalizes understanding of the suicide prevention education information provided.  The family member/significant other agrees to remove the items of safety concern listed above.  Carloyn Jaeger Grossman-Orr 06/16/2019, 8:33 AM

## 2019-06-16 NOTE — [facility] Group Notes (Signed)
Adult Psychoeducational Group Note  Date:  06/16/2019 Time:  11:20 AM  Group Topic/Focus:  progressive relaxation  PROGRESSIVE RELAXATION.Marland Kitchen A group where deep breathing is taught and tensing and relaxation muscle groups is used. Imagery is used with both deep breathing and tensing and relaxing of the muscle groups. Pt;s are asked to imagine 3 pillars that hold them up when they are not able to hold themselves up.   Participation Level:  Active  Participation Quality:  Appropriate  Affect:  Appropriate  Cognitive:  Appropriate  Insight: Lacking  Engagement in Group:  Engaged  Modes of Intervention:  Discussion and Education  Additional Comments:  Pt participated fully in the exercise of progressive relaxation. States what holds him up is his grandmother, friends and his dog.  Vira Blanco A 06/16/2019, 11:20 AM

## 2019-06-16 NOTE — [facility] Suicide Risk Assessment (Signed)
[facility] Discharge Suicide Risk Assessment   Principal Problem: Bipolar disorder [facility]) Discharge Diagnoses: Principal Problem:   Bipolar disorder (HCC)   Total Time spent with patient: 20 minutes  Musculoskeletal: Strength & Muscle Tone: within normal limits Gait & Station: normal Patient leans: N/A  Psychiatric Specialty Exam: Review of Systems  Musculoskeletal: Positive for joint swelling.  All other systems reviewed and are negative.   Blood pressure 111/69, pulse 85, temperature 98.2 F (36.8 C), temperature source Oral, resp. rate 16, height 5\' 9"  (1.753 m), weight 60.3 kg, SpO2 100 %.Body mass index is 19.64 kg/m.  General Appearance: Casual  Eye Contact::  Fair  Speech:  Normal Rate409  Volume:  Normal  Mood:  Euthymic  Affect:  Congruent  Thought Process:  Coherent and Descriptions of Associations: Intact  Orientation:  Full (Time, Place, and Person)  Thought Content:  Logical  Suicidal Thoughts:  No  Homicidal Thoughts:  No  Memory:  Immediate;   Fair Recent;   Fair Remote;   Fair  Judgement:  Intact  Insight:  Fair  Psychomotor Activity:  Normal  Concentration:  Good  Recall:  Good  Fund of Knowledge:Good  Language: Good  Akathisia:  Negative  Handed:  Right  AIMS (if indicated):     Assets:  Desire for Improvement Housing Resilience Talents/Skills  Sleep:  Number of Hours: 6  Cognition: WNL  ADL's:  Intact   Mental Status Per Nursing Assessment::   On Admission:  Suicidal ideation indicated by others  Demographic Factors:  Male, Low socioeconomic status, Living alone and Unemployed  Loss Factors: NA  Historical Factors: Impulsivity  Risk Reduction Factors:   Positive therapeutic relationship  Continued Clinical Symptoms:  Depression:   Comorbid alcohol abuse/dependence Impulsivity Alcohol/Substance Abuse/Dependencies  Cognitive Features That Contribute To Risk:  None    Suicide Risk:  Minimal: No identifiable suicidal ideation.   Patients presenting with no risk factors but with morbid ruminations; may be classified as minimal risk based on the severity of the depressive symptoms  Follow-up Information    [phone], MD. Schedule an appointment as soon as possible for a visit in 1 week.   Specialty: Orthopedic Surgery Why: For recheck Contact information: [address] [place] [phone]        [facility]-EMERGENCY DEPT.   Specialty: Emergency Medicine Why: Return to the ER with any new or worsening symptoms Contact information: [address] [phone] [employer] [address] [place] [phone]       [facility]. Go on 06/18/2019.   Specialty: Behavioral Health Why: You have an appointment on 06/18/19 at 5:00 pm with [facility].  This appointment will be held in person.  Contact information: [address] [place] [phone]           Plan Of Care/Follow-up recommendations:  Activity:  ad lib Tests:  keep appointment for repair of facture Other:  do not use marijuana or other drugs or alcohol  [phone], MD 06/16/2019, 8:04 AM

## 2019-06-16 NOTE — [facility] Group Notes (Signed)
[facility] LCSW Group Therapy Note  06/16/2019    Type of Therapy and Topic:  Group Therapy:  Adding Supports Including Yourself  Participation Level:  Active   Description of Group:   Patients in this group were introduced to the concept that additional supports including self-support are an essential part of recovery.  Patients listed what supports they believe they need to add to their lives to achieve their goals at discharge, and they listed such things as therapist, family, doctor, support groups, 12-step groups and service animals.   A song entitled "My Own Hero" was played and a group discussion ensued in which patients stated they could relate to the song and it inspired them to realize they have be willing to help themselves in order to succeed, because other people cannot achieve sobriety or stability for them.  "Fight For It" was played, then "I Am Enough" to encourage patients.  They discussed the impact on them and how they must remain convinced that their lives are worth the effort it takes to become sober and/or stable.  Therapeutic Goals: 1)  demonstrate the importance of being a key part of one's own support system 2)  discuss various available supports 3)  encourage patient to use music as part of their self-support and focus on goals 4)  elicit ideas from patients about supports that need to be added   Summary of Patient Progress:  The patient expressed that he has found his dog to be a source of healthy support, while his family is "selfish and do not care about me."  He listened intently to the songs played and made positive, insightful remarks about them.  His affect remained blunted and his mood depressed.  Therapeutic Modalities:   Motivational Interviewing Activity  Mariane Burpee J Grossman-Orr  11:30 AM

## 2019-06-16 NOTE — Progress Notes (Signed)
Discharge note: Pt denies SI/HI. Pt calm and cooperative at this time. Patient received both written and verbal discharge instructions. Pt verbalized understanding of the discharge instructions provided to him. Pt received an AVS, SRA, transitional record and prescriptions. Pt gathered belongings from his room and assigned locker. Pt safely discharged to the lobby and his grandmother was present in the lobby at that time.

## 2019-06-16 NOTE — Progress Notes (Signed)
D. Pt has been calm and cooperative, pleasant during interactions- observed in the milieu interacting appropriately with peers. Per pt's self inventory, pt rated his depression, hopelessness and anxiety all 0's. Soft cast remains dry and intact R hand- color to R hand good- no signs of swelling- denies pain.   Pt currently denies SI/HI and AVH  A. Labs and vitals monitored. Pt compliant with medication this am. Pt supported emotionally and encouraged to express concerns and ask questions.   R. Pt remains safe with 15 minute checks. Will continue POC.

## 2019-06-18 ENCOUNTER — Ambulatory Visit (INDEPENDENT_AMBULATORY_CARE_PROVIDER_SITE_OTHER): Payer: BC Managed Care – PPO | Admitting: Psychiatry

## 2019-06-18 ENCOUNTER — Other Ambulatory Visit: Payer: Self-pay

## 2019-06-18 DIAGNOSIS — F341 Dysthymic disorder: Secondary | ICD-10-CM | POA: Diagnosis not present

## 2019-06-18 NOTE — Progress Notes (Signed)
[facility] Counselor/Therapist Progress Note  Patient ID: Kevin Shepherd, MRN: [id number],    Date: 06/18/2019  Time Spent: 51 minutes start time 5:02 PM end time 5:53 PM  Treatment Type: Individual Therapy  Reported Symptoms: Anxiety, sadness, focusing issues, anger  Mental Status Exam:  Appearance:   Casual     Behavior:  Appropriate  Motor:  Normal  Speech/Language:   Normal Rate  Affect:  Congruent  Mood:  sad  Thought process:  normal  Thought content:    WNL  Sensory/Perceptual disturbances:    WNL  Orientation:  oriented to person, place, time/date and situation  Attention:  Good  Concentration:  Good  Memory:  WNL  Fund of knowledge:   Good  Insight:    Good  Judgment:   Good  Impulse Control:  Good   Risk Assessment: Danger to Self:  No Self-injurious Behavior: No Danger to Others: No Duty to Warn:no Physical Aggression / Violence:No  Access to Firearms a concern: No  Gang Involvement:No   Subjective: Patient was present for session.  He reported that he did go to the hospital for 4 days.  He explained  There had been some issues with his maitenence man where he was living.  He went to court over it and he was trying to get out of the apartment.  He shared at the same time he had issues with his parents.  Patient will be having surgery on his hand because he broke bones when he had a while due to all the anger he was feeling inside.  Patient reported at this point he is just wanting to move forward and get out of the apartment but he is not sure who he trusts and feels comfortable with.  He was able to recognize he does feel like he can trust his grandmother and a friend of his that is helping him with his dog.  Patient agreed to talk to his grandmother about the situation and to get her to help him figure out what to do while he is trying to figure out where to live and how he can take care of himself during the surgery and recovery.  Patient was  encouraged to take medication as directed.  He explained he is afraid to because when he was young his parents would give him his medication and then for what ever reason stop it cold [place] and he would go into a like a withdrawal state where he was sweating and shaking and felt horrible.  He explained he does not want that to happen again so is very fearful of taking medications.  Discussed talking to Dr. Creig Hines about may be some as needed medications so he can take him when he needs to bring his emotions down because things are getting overwhelming but he may not have to take them every day.  Patient agreed to think about that.  He also shared more of his issues with his parents and growing up.  He explained that his father has always been in [place] and they moved down here and he is not seen him since he was 89.  He explained he remembered at that time his father telling him that no matter what anybody told him he loved him.  He is currently in contact with his father but does not feel he can move back up there with him due to the situation at home.  Patient shared he is very angry with  his stepfather and mother and feels that he often create more issues than they are helpful.  Patient was encouraged to try and focus on taking care of himself currently and working on the other issues from the past and treatment.  Patient agreed that at this time he has to feel safe and get his needs met so that is what he will focus on first.  Interventions: Cognitive Behavioral Therapy and Solution-Oriented/Positive Psychology  Diagnosis:   ICD-10-CM   1. Persistent depressive disorder with atypical features, currently moderate  F34.1     Plan: Patient is to work on using coping skills to help decrease depressive symptoms.  Patient is to communicate with his grandmother and see if she can help him develop a plan for a living situation and how to get through the upcoming surgery.  Patient is also to continue  communicating with his father for assistance.  He is to consider talking with Dr. Creig Hines about a as needed medication or something that he can take that he will not have withdrawal from. Long-term goal: Develop healthy cognitive patterns and beliefs about the self in the world that lead to alleviation and help prevent the relapse of depression symptoms Short-term goal: Express feelings of hurt disappointment shame and anger that are associated with early life experiences  Lina Sayre, [facility]

## 2019-06-24 ENCOUNTER — Other Ambulatory Visit (HOSPITAL_COMMUNITY)
Admission: RE | Admit: 2019-06-24 | Discharge: 2019-06-24 | Disposition: A | Discharge status: Home / Self Care | Payer: BC Managed Care – PPO | Source: Ambulatory Visit | Attending: Orthopedic Surgery | Admitting: Orthopedic Surgery

## 2019-06-24 ENCOUNTER — Other Ambulatory Visit: Payer: Self-pay

## 2019-06-24 ENCOUNTER — Encounter (HOSPITAL_BASED_OUTPATIENT_CLINIC_OR_DEPARTMENT_OTHER): Payer: Self-pay | Admitting: Orthopedic Surgery

## 2019-06-24 ENCOUNTER — Other Ambulatory Visit: Payer: Self-pay | Admitting: Orthopedic Surgery

## 2019-06-24 DIAGNOSIS — Z01812 Encounter for preprocedural laboratory examination: Secondary | ICD-10-CM | POA: Diagnosis not present

## 2019-06-24 DIAGNOSIS — Z20822 Contact with and (suspected) exposure to covid-19: Secondary | ICD-10-CM | POA: Insufficient documentation

## 2019-06-24 LAB — SARS CORONAVIRUS 2 (TAT 6-24 HRS): SARS Coronavirus 2: NEGATIVE

## 2019-06-24 NOTE — Progress Notes (Signed)
Chart and recent hospitalization reviewed with Dr. Salvadore Farber. Ok to proceed with surgery as scheduled.

## 2019-06-25 ENCOUNTER — Encounter (HOSPITAL_BASED_OUTPATIENT_CLINIC_OR_DEPARTMENT_OTHER)
Admission: RE | Disposition: A | Discharge status: Home / Self Care | Payer: Self-pay | Source: Home / Self Care | Attending: Orthopedic Surgery

## 2019-06-25 ENCOUNTER — Ambulatory Visit (HOSPITAL_BASED_OUTPATIENT_CLINIC_OR_DEPARTMENT_OTHER): Payer: BC Managed Care – PPO | Admitting: Anesthesiology

## 2019-06-25 ENCOUNTER — Other Ambulatory Visit: Payer: Self-pay

## 2019-06-25 ENCOUNTER — Encounter (HOSPITAL_BASED_OUTPATIENT_CLINIC_OR_DEPARTMENT_OTHER): Payer: Self-pay | Admitting: Orthopedic Surgery

## 2019-06-25 ENCOUNTER — Ambulatory Visit (HOSPITAL_BASED_OUTPATIENT_CLINIC_OR_DEPARTMENT_OTHER)
Admission: RE | Admit: 2019-06-25 | Discharge: 2019-06-25 | Disposition: A | Discharge status: Home / Self Care | Payer: BC Managed Care – PPO | Attending: Orthopedic Surgery | Admitting: Orthopedic Surgery

## 2019-06-25 DIAGNOSIS — W228XXA Striking against or struck by other objects, initial encounter: Secondary | ICD-10-CM | POA: Insufficient documentation

## 2019-06-25 DIAGNOSIS — F319 Bipolar disorder, unspecified: Secondary | ICD-10-CM | POA: Insufficient documentation

## 2019-06-25 DIAGNOSIS — Z79899 Other long term (current) drug therapy: Secondary | ICD-10-CM | POA: Diagnosis not present

## 2019-06-25 DIAGNOSIS — S62336A Displaced fracture of neck of fifth metacarpal bone, right hand, initial encounter for closed fracture: Secondary | ICD-10-CM | POA: Insufficient documentation

## 2019-06-25 DIAGNOSIS — W2201XA Walked into wall, initial encounter: Secondary | ICD-10-CM | POA: Insufficient documentation

## 2019-06-25 DIAGNOSIS — F419 Anxiety disorder, unspecified: Secondary | ICD-10-CM | POA: Insufficient documentation

## 2019-06-25 HISTORY — DX: Anxiety disorder, unspecified: F41.9

## 2019-06-25 HISTORY — PX: CLOSED REDUCTION METACARPAL WITH PERCUTANEOUS PINNING: SHX5613

## 2019-06-25 HISTORY — DX: Bipolar disorder, unspecified: F31.9

## 2019-06-25 SURGERY — CLOSED REDUCTION, FRACTURE, METACARPAL BONE, WITH PERCUTANEOUS PINNING
Anesthesia: Monitor Anesthesia Care | Site: Hand | Laterality: Right

## 2019-06-25 MED ORDER — CEFAZOLIN SODIUM-DEXTROSE 2-4 GM/100ML-% IV SOLN
INTRAVENOUS | Status: AC
  Filled 2019-06-25: qty 100

## 2019-06-25 MED ORDER — OXYCODONE HCL 5 MG/5ML PO SOLN: 5.0000 mg | Freq: Once | ORAL | Status: DC | PRN

## 2019-06-25 MED ORDER — FENTANYL CITRATE (PF) 100 MCG/2ML IJ SOLN
INTRAMUSCULAR | Status: AC
  Filled 2019-06-25: qty 2

## 2019-06-25 MED ORDER — FENTANYL CITRATE (PF) 250 MCG/5ML IJ SOLN
INTRAMUSCULAR | Status: DC | PRN
  Administered 2019-06-25: 50 ug via INTRAVENOUS

## 2019-06-25 MED ORDER — LIDOCAINE 2% (20 MG/ML) 5 ML SYRINGE
INTRAMUSCULAR | Status: DC | PRN
  Administered 2019-06-25: 40 mg via INTRAVENOUS

## 2019-06-25 MED ORDER — OXYCODONE HCL 5 MG PO TABS: 5.0000 mg | ORAL_TABLET | Freq: Once | ORAL | Status: DC | PRN

## 2019-06-25 MED ORDER — CEFAZOLIN SODIUM-DEXTROSE 2-4 GM/100ML-% IV SOLN
2.0000 g | INTRAVENOUS | Status: AC
  Administered 2019-06-25: 2 g via INTRAVENOUS

## 2019-06-25 MED ORDER — PROPOFOL 500 MG/50ML IV EMUL
INTRAVENOUS | Status: AC
  Filled 2019-06-25: qty 50

## 2019-06-25 MED ORDER — HYDROMORPHONE HCL 1 MG/ML IJ SOLN: 0.2500 mg | INTRAMUSCULAR | Status: DC | PRN

## 2019-06-25 MED ORDER — PROMETHAZINE HCL 25 MG/ML IJ SOLN: 6.2500 mg | INTRAMUSCULAR | Status: DC | PRN

## 2019-06-25 MED ORDER — HYDROCODONE-ACETAMINOPHEN 5-325 MG PO TABS: ORAL_TABLET | ORAL | 0 refills | Status: AC

## 2019-06-25 MED ORDER — MIDAZOLAM HCL 2 MG/2ML IJ SOLN
INTRAMUSCULAR | Status: AC
  Filled 2019-06-25: qty 2

## 2019-06-25 MED ORDER — ROPIVACAINE HCL 5 MG/ML IJ SOLN
INTRAMUSCULAR | Status: DC | PRN
  Administered 2019-06-25: 30 mL via PERINEURAL

## 2019-06-25 MED ORDER — LACTATED RINGERS IV SOLN: INTRAVENOUS | Status: DC

## 2019-06-25 MED ORDER — MEPERIDINE HCL 25 MG/ML IJ SOLN: 6.2500 mg | INTRAMUSCULAR | Status: DC | PRN

## 2019-06-25 MED ORDER — MIDAZOLAM HCL 2 MG/2ML IJ SOLN
1.0000 mg | INTRAMUSCULAR | Status: DC | PRN
  Administered 2019-06-25: 2 mg via INTRAVENOUS

## 2019-06-25 MED ORDER — PROPOFOL 500 MG/50ML IV EMUL
INTRAVENOUS | Status: DC | PRN
  Administered 2019-06-25: 120 ug/kg/min via INTRAVENOUS

## 2019-06-25 MED ORDER — KETOROLAC TROMETHAMINE 30 MG/ML IJ SOLN: 30.0000 mg | Freq: Once | INTRAMUSCULAR | Status: DC | PRN

## 2019-06-25 MED ORDER — ACETAMINOPHEN 500 MG PO TABS
1000.0000 mg | ORAL_TABLET | Freq: Once | ORAL | Status: AC
  Administered 2019-06-25: 1000 mg via ORAL

## 2019-06-25 MED ORDER — ACETAMINOPHEN 500 MG PO TABS
ORAL_TABLET | ORAL | Status: AC
  Filled 2019-06-25: qty 2

## 2019-06-25 MED ORDER — FENTANYL CITRATE (PF) 100 MCG/2ML IJ SOLN
50.0000 ug | INTRAMUSCULAR | Status: DC | PRN
  Administered 2019-06-25: 100 ug via INTRAVENOUS

## 2019-06-25 SURGICAL SUPPLY — 61 items
BLADE MINI RND TIP GREEN BEAV (BLADE) IMPLANT
BLADE SURG 15 STRL LF DISP TIS (BLADE) ×4 IMPLANT
BLADE SURG 15 STRL SS (BLADE) ×8
BNDG ELASTIC 2X5.8 VLCR STR LF (GAUZE/BANDAGES/DRESSINGS) IMPLANT
BNDG ELASTIC 3X5.8 VLCR STR LF (GAUZE/BANDAGES/DRESSINGS) ×4 IMPLANT
BNDG ESMARK 4X9 LF (GAUZE/BANDAGES/DRESSINGS) ×4 IMPLANT
BNDG GAUZE ELAST 4 BULKY (GAUZE/BANDAGES/DRESSINGS) ×4 IMPLANT
CHLORAPREP W/TINT 26 (MISCELLANEOUS) ×4 IMPLANT
CORD BIPOLAR FORCEPS 12FT (ELECTRODE) ×4 IMPLANT
COVER BACK TABLE 60X90IN (DRAPES) ×4 IMPLANT
COVER MAYO STAND STRL (DRAPES) ×4 IMPLANT
COVER WAND RF STERILE (DRAPES) IMPLANT
CUFF TOURN SGL QUICK 18X4 (TOURNIQUET CUFF) ×4 IMPLANT
DRAPE EXTREMITY T 121X128X90 (DISPOSABLE) ×4 IMPLANT
DRAPE OEC MINIVIEW 54X84 (DRAPES) ×4 IMPLANT
DRAPE SURG 17X23 STRL (DRAPES) ×4 IMPLANT
GAUZE SPONGE 4X4 12PLY STRL (GAUZE/BANDAGES/DRESSINGS) ×4 IMPLANT
GAUZE XEROFORM 1X8 LF (GAUZE/BANDAGES/DRESSINGS) ×4 IMPLANT
GLOVE BIO SURGEON STRL SZ 6.5 (GLOVE) ×3 IMPLANT
GLOVE BIO SURGEON STRL SZ7.5 (GLOVE) ×4 IMPLANT
GLOVE BIO SURGEONS STRL SZ 6.5 (GLOVE) ×1
GLOVE BIOGEL PI IND STRL 6.5 (GLOVE) ×2 IMPLANT
GLOVE BIOGEL PI IND STRL 7.0 (GLOVE) ×2 IMPLANT
GLOVE BIOGEL PI IND STRL 8 (GLOVE) ×2 IMPLANT
GLOVE BIOGEL PI IND STRL 8.5 (GLOVE) ×2 IMPLANT
GLOVE BIOGEL PI INDICATOR 6.5 (GLOVE) ×2
GLOVE BIOGEL PI INDICATOR 7.0 (GLOVE) ×2
GLOVE BIOGEL PI INDICATOR 8 (GLOVE) ×2
GLOVE BIOGEL PI INDICATOR 8.5 (GLOVE) ×2
GLOVE SURG ORTHO 8.0 STRL STRW (GLOVE) ×4 IMPLANT
GOWN STRL REUS W/ TWL LRG LVL3 (GOWN DISPOSABLE) ×2 IMPLANT
GOWN STRL REUS W/TWL LRG LVL3 (GOWN DISPOSABLE) ×4
GOWN STRL REUS W/TWL XL LVL3 (GOWN DISPOSABLE) ×8 IMPLANT
K-WIRE .035X4 (WIRE) ×12 IMPLANT
NEEDLE HYPO 22GX1.5 SAFETY (NEEDLE) IMPLANT
NEEDLE HYPO 25X1 1.5 SAFETY (NEEDLE) IMPLANT
NS IRRIG 1000ML POUR BTL (IV SOLUTION) ×4 IMPLANT
PACK BASIN DAY SURGERY FS (CUSTOM PROCEDURE TRAY) ×4 IMPLANT
PAD CAST 3X4 CTTN HI CHSV (CAST SUPPLIES) ×2 IMPLANT
PAD CAST 4YDX4 CTTN HI CHSV (CAST SUPPLIES) IMPLANT
PADDING CAST ABS 4INX4YD NS (CAST SUPPLIES)
PADDING CAST ABS COTTON 4X4 ST (CAST SUPPLIES) IMPLANT
PADDING CAST COTTON 3X4 STRL (CAST SUPPLIES) ×4
PADDING CAST COTTON 4X4 STRL (CAST SUPPLIES)
SLEEVE SCD COMPRESS KNEE MED (MISCELLANEOUS) IMPLANT
SLING ARM FOAM STRAP LRG (SOFTGOODS) ×4 IMPLANT
SPLINT PLASTER CAST XFAST 3X15 (CAST SUPPLIES) ×40 IMPLANT
SPLINT PLASTER CAST XFAST 4X15 (CAST SUPPLIES) IMPLANT
SPLINT PLASTER XTRA FAST SET 4 (CAST SUPPLIES)
SPLINT PLASTER XTRA FASTSET 3X (CAST SUPPLIES) ×40
STOCKINETTE 4X48 STRL (DRAPES) ×4 IMPLANT
SUT ETHILON 3 0 PS 1 (SUTURE) IMPLANT
SUT ETHILON 4 0 PS 2 18 (SUTURE) ×4 IMPLANT
SUT MERSILENE 4 0 P 3 (SUTURE) IMPLANT
SUT VIC AB 3-0 PS1 18 (SUTURE)
SUT VIC AB 3-0 PS1 18XBRD (SUTURE) IMPLANT
SUT VICRYL 4-0 PS2 18IN ABS (SUTURE) IMPLANT
SYR BULB EAR ULCER 3OZ GRN STR (SYRINGE) ×4 IMPLANT
SYR CONTROL 10ML LL (SYRINGE) IMPLANT
TOWEL GREEN STERILE FF (TOWEL DISPOSABLE) ×4 IMPLANT
UNDERPAD 30X36 HEAVY ABSORB (UNDERPADS AND DIAPERS) ×4 IMPLANT

## 2019-06-25 NOTE — Anesthesia Procedure Notes (Signed)
Anesthesia Regional Block: Supraclavicular block   Pre-Anesthetic Checklist: ,, timeout performed, Correct Patient, Correct Site, Correct Laterality, Correct Procedure, Correct Position, site marked, Risks and benefits discussed,  Surgical consent,  Pre-op evaluation,  At surgeon's request and post-op pain management  Laterality: Right  Prep: chloraprep       Needles:  Injection technique: Single-shot  Needle Type: Stimiplex     Needle Length: 9cm  Needle Gauge: 21     Additional Needles:   Procedures:,,,, ultrasound used (permanent image in chart),,,,  Narrative:  Start time: 06/25/2019 12:26 PM End time: 06/25/2019 12:31 PM Injection made incrementally with aspirations every 5 mL.  Performed by: Personally  Anesthesiologist: Lowella Curb, MD

## 2019-06-25 NOTE — H&P (Signed)
Ayoub Pieter Partridge Dayyan Krist is an 19 y.o. male.   Chief Complaint: hand fracture HPI: 19 yo male states he punched a wall 1.5 weeks ago injuring right hand.  Seen in ED where XR revealed right small metacarpal fracture.  Splinted and followed up in office.  Noted to have extension lag and some scissoring.  He wishes to proceed with operative reduction and fixation.  Allergies: No Known Allergies  Past Medical History:  Diagnosis Date  . Anxiety   . Bipolar disorder ([place])     History reviewed. No pertinent surgical history.  Family History: History reviewed. No pertinent family history.  Social History:   reports that he has never smoked. He has never used smokeless tobacco. He reports current alcohol use. He reports current drug use. Drug: Marijuana.  Medications: Medications Prior to Admission  Medication Sig Dispense Refill  . ibuprofen (ADVIL,MOTRIN) 800 MG tablet Take 1 tablet (800 mg total) by mouth 3 (three) times daily. 21 tablet 0  . hydrOXYzine (ATARAX/VISTARIL) 25 MG tablet Take 1 tablet (25 mg total) by mouth 3 (three) times daily as needed for anxiety. 75 tablet 0  . OLANZapine zydis (ZYPREXA) 10 MG disintegrating tablet Take 1 tablet (10 mg total) by mouth at bedtime. For mood control 30 tablet 0  . OXcarbazepine (TRILEPTAL) 150 MG tablet Take 0.5 tablets (75 mg total) by mouth 2 (two) times daily. For mood stabilization 60 tablet 0  . traZODone (DESYREL) 100 MG tablet Take 2 tablets (200 mg total) by mouth at bedtime. For sleep 30 tablet 0    Results for orders placed or performed during the hospital encounter of 06/24/19 (from the past 48 hour(s))  SARS CORONAVIRUS 2 (TAT 6-24 HRS) Nasopharyngeal Nasopharyngeal Swab     Status: None   Collection Time: 06/24/19 11:24 AM   Specimen: Nasopharyngeal Swab  Result Value Ref Range   SARS Coronavirus 2 NEGATIVE NEGATIVE    Comment: (NOTE) SARS-CoV-2 target nucleic acids are NOT DETECTED. The SARS-CoV-2 RNA is generally  detectable in upper and lower respiratory specimens during the acute phase of infection. Negative results do not preclude SARS-CoV-2 infection, do not rule out co-infections with other pathogens, and should not be used as the sole basis for treatment or other patient management decisions. Negative results must be combined with clinical observations, patient history, and epidemiological information. The expected result is Negative. Fact Sheet for Patients: SugarRoll.be Fact Sheet for Healthcare Providers: https://www.woods-mathews.com/ This test is not yet approved or cleared by the [place] FDA and  has been authorized for detection and/or diagnosis of SARS-CoV-2 by FDA under an Emergency Use Authorization (EUA). This EUA will remain  in effect (meaning this test can be used) for the duration of the COVID-19 declaration under Section 56 4(b)(1) of the Act, 21 U.S.C. section 360bbb-3(b)(1), unless the authorization is terminated or revoked sooner. Performed at [facility], [place] [address] [place], [place] [address]     No results found.   A comprehensive review of systems was negative.  Blood pressure 130/75, pulse 81, temperature 97.7 F (36.5 C), temperature source Tympanic, resp. rate 19, height 5\' 9"  (1.753 m), weight 62.9 kg, SpO2 98 %.  General appearance: alert, cooperative and appears stated age Head: Normocephalic, without obvious abnormality, atraumatic Neck: supple, symmetrical, trachea midline Cardio: regular rate and rhythm Resp: clear to auscultation bilaterally Extremities: Intact sensation and capillary refill all digits.  +epl/fpl/io.  No wounds.  Pulses: 2+ and symmetric Skin: Skin color, texture, turgor normal.  No rashes or lesions Neurologic: Grossly normal Incision/Wound: none  Assessment/Plan Right small metacarpal neck fracture.  Non operative and operative treatment options have been discussed with  the patient and patient wishes to proceed with operative treatment. Risks, benefits, and alternatives of surgery have been discussed and the patient agrees with the plan of care.   Betha Loa 06/25/2019, 12:37 PM

## 2019-06-25 NOTE — Discharge Instructions (Addendum)
[facility] Instructions Hand Surgery  Wound Care: Keep your hand elevated above the level of your heart.  Do not allow it to dangle by your side.  Keep the dressing dry and do not remove it unless your doctor advises you to do so.  He will usually change it at the time of your post-op visit.  Moving your fingers is advised to stimulate circulation but will depend on the site of your surgery.  If you have a splint applied, your doctor will advise you regarding movement.  Activity: Do not drive or operate machinery today.  Rest today and then you may return to your normal activity and work as indicated by your physician.  Diet:  Drink liquids today or eat a light diet.  You may resume a regular diet tomorrow.    General expectations: Pain for two to three days. Fingers may become slightly swollen.  Call your doctor if any of the following occur: Severe pain not relieved by pain medication. Elevated temperature. Dressing soaked with blood. Inability to move fingers. White or bluish color to fingers.  *May take Tylenol at 6pm today, 06/25/19   Post Anesthesia Home Care Instructions  Activity: Get plenty of rest for the remainder of the day. A responsible individual must stay with you for 24 hours following the procedure.  For the next 24 hours, DO NOT: -Drive a car -Advertising copywriter -Drink alcoholic beverages -Take any medication unless instructed by your physician -Make any legal decisions or sign important papers.  Meals: Start with liquid foods such as gelatin or soup. Progress to regular foods as tolerated. Avoid greasy, spicy, heavy foods. If nausea and/or vomiting occur, drink only clear liquids until the nausea and/or vomiting subsides. Call your physician if vomiting continues.  Special Instructions/Symptoms: Your throat may feel dry or sore from the anesthesia or the breathing tube placed in your throat during surgery. If this causes discomfort, gargle with warm salt  water. The discomfort should disappear within 24 hours.  Regional Anesthesia Blocks  1. Numbness or the inability to move the "blocked" extremity may last from 3-48 hours after placement. The length of time depends on the medication injected and your individual response to the medication. If the numbness is not going away after 48 hours, call your surgeon.  2. The extremity that is blocked will need to be protected until the numbness is gone and the  Strength has returned. Because you cannot feel it, you will need to take extra care to avoid injury. Because it may be weak, you may have difficulty moving it or using it. You may not know what position it is in without looking at it while the block is in effect.  3. For blocks in the legs and feet, returning to weight bearing and walking needs to be done carefully. You will need to wait until the numbness is entirely gone and the strength has returned. You should be able to move your leg and foot normally before you try and bear weight or walk. You will need someone to be with you when you first try to ensure you do not fall and possibly risk injury.  4. Bruising and tenderness at the needle site are common side effects and will resolve in a few days.  5. Persistent numbness or new problems with movement should be communicated to the surgeon or the [facility] [phone] [facility] [phone]).

## 2019-06-25 NOTE — Anesthesia Preprocedure Evaluation (Addendum)
Anesthesia Evaluation  Patient identified by MRN, date of birth, ID band Patient awake    Reviewed: Allergy & Precautions, NPO status , Patient's Chart, lab work & pertinent test results  Airway Mallampati: II  TM Distance: >3 FB Neck ROM: Full    Dental no notable dental hx.    Pulmonary neg pulmonary ROS, Patient abstained from smoking.,    Pulmonary exam normal breath sounds clear to auscultation       Cardiovascular negative cardio ROS Normal cardiovascular exam Rhythm:Regular Rate:Normal     Neuro/Psych PSYCHIATRIC DISORDERS Anxiety Depression Bipolar Disorder negative neurological ROS     GI/Hepatic negative GI ROS, (+)     substance abuse  marijuana use,   Endo/Other  negative endocrine ROS  Renal/GU negative Renal ROS  negative genitourinary   Musculoskeletal Right small finger metacarpal fx   Abdominal   Peds negative pediatric ROS (+)  Hematology negative hematology ROS (+)   Anesthesia Other Findings   Reproductive/Obstetrics negative OB ROS                            Anesthesia Physical Anesthesia Plan  ASA: II  Anesthesia Plan: MAC and Regional   Post-op Pain Management:  Regional for Post-op pain   Induction:   PONV Risk Score and Plan: 2 and Propofol infusion and TIVA  Airway Management Planned: Natural Airway and Simple Face Mask  Additional Equipment: None  Intra-op Plan:   Post-operative Plan:   Informed Consent: I have reviewed the patients History and Physical, chart, labs and discussed the procedure including the risks, benefits and alternatives for the proposed anesthesia with the patient or authorized representative who has indicated his/her understanding and acceptance.       Plan Discussed with: CRNA  Anesthesia Plan Comments:         Anesthesia Quick Evaluation

## 2019-06-25 NOTE — Anesthesia Postprocedure Evaluation (Signed)
Anesthesia Post Note  Patient: Kevin Shepherd  Procedure(s) Performed: CLOSED REDUCTION METACARPAL WITH PERCUTANEOUS PINNING (Right Hand)     Patient location during evaluation: PACU Anesthesia Type: MAC and Regional Level of consciousness: awake and alert Pain management: pain level controlled Vital Signs Assessment: post-procedure vital signs reviewed and stable Respiratory status: spontaneous breathing, nonlabored ventilation and respiratory function stable Cardiovascular status: blood pressure returned to baseline and stable Postop Assessment: no apparent nausea or vomiting Anesthetic complications: no    Last Vitals:  Vitals:   06/25/19 1330 06/25/19 1345  BP: 120/88 (!) 134/91  Pulse: 74 84  Resp: 20 14  Temp:    SpO2: 100% 100%    Last Pain:  Vitals:   06/25/19 1345  TempSrc:   PainSc: 0-No pain                 Lynda Rainwater

## 2019-06-25 NOTE — Transfer of Care (Signed)
Immediate Anesthesia Transfer of Care Note  Patient: Kevin Shepherd  Procedure(s) Performed: CLOSED REDUCTION METACARPAL WITH PERCUTANEOUS PINNING (Right Hand)  Patient Location: PACU  Anesthesia Type:MAC combined with regional for post-op pain  Level of Consciousness: awake, alert , oriented and patient cooperative  Airway & Oxygen Therapy: Patient Spontanous Breathing and Patient connected to face mask oxygen  Post-op Assessment: Report given to RN, Post -op Vital signs reviewed and stable and Patient moving all extremities  Post vital signs: Reviewed and stable  Last Vitals:  Vitals Value Taken Time  BP 112/81 06/25/19 1320  Temp    Pulse 77 06/25/19 1322  Resp 14 06/25/19 1322  SpO2 100 % 06/25/19 1322  Vitals shown include unvalidated device data.  Last Pain:  Vitals:   06/25/19 1225  TempSrc:   PainSc: 0-No pain         Complications: No apparent anesthesia complications

## 2019-06-25 NOTE — Progress Notes (Signed)
Assisted Dr. Miller with right, ultrasound guided, supraclavicular block. Side rails up, monitors on throughout procedure. See vital signs in flow sheet. Tolerated Procedure well. 

## 2019-06-25 NOTE — Op Note (Addendum)
NAME: Kevin Shepherd MEDICAL RECORD NO: [id number] DATE OF BIRTH: Aug 07, 2000 FACILITY: [facility] LOCATION: [facility] PHYSICIAN: Tami Ribas, MD   OPERATIVE REPORT   DATE OF PROCEDURE: 06/25/19    PREOPERATIVE DIAGNOSIS:   Right small finger metacarpal neck fracture   POSTOPERATIVE DIAGNOSIS:   Right small finger metacarpal neck fracture   PROCEDURE:   Closed reduction pin fixation right small finger metacarpal neck fracture   SURGEON:  Betha Loa, M.D.   ASSISTANT: Cindee Salt, MD   ANESTHESIA:  Regional with sedation   INTRAVENOUS FLUIDS:  Per anesthesia flow sheet.   ESTIMATED BLOOD LOSS:  Minimal.   COMPLICATIONS:  None.   SPECIMENS:  none   TOURNIQUET TIME:    Total Tourniquet Time Documented: Upper Arm (Right) - 14 minutes Total: Upper Arm (Right) - 14 minutes    DISPOSITION:  Stable to PACU.   INDICATIONS: 19 year old male states approximate 1 and half weeks ago he punched a wall injuring his right hand.  He was seen at the emergency department where radiographs were taken revealing small finger metacarpal neck fracture.  He was splinted and follow-up in the office.  He had extension lag and some scissoring of the small and ring fingers.  He was to proceed with operative reduction and fixation. Risks, benefits and alternatives of surgery were discussed including the risks of blood loss, infection, damage to nerves, vessels, tendons, ligaments, bone for surgery, need for additional surgery, complications with wound healing, continued pain, nonunion, malunion, stiffness.  He voiced understanding of these risks and elected to proceed.  OPERATIVE COURSE:  After being identified preoperatively by myself,  the patient and I agreed on the procedure and site of the procedure.  The surgical site was marked.  Surgical consent had been signed. He was given IV antibiotics as preoperative antibiotic prophylaxis. He was transferred to the operating room and  placed on the operating table in supine position with the Right upper extremity on an arm board.  Sedation was induced by the anesthesiologist. A regional block had been performed by anesthesia in preoperative holding.   Right upper extremity was prepped and draped in normal sterile orthopedic fashion.  A surgical pause was performed between the surgeons, anesthesia, and operating room staff and all were in agreement as to the patient, procedure, and site of procedure.  Tourniquet at the proximal aspect of the extremity was inflated to 250 mmHg after exsanguination of the arm with an Esmarch bandage.    C-arm was used in AP lateral oblique projections throughout the case.  Closed reduction of the right small finger metacarpal neck fracture was performed.  Good reduction of the fracture was obtained.  The wrist was placed through tenodesis.  There was improvement in the scissoring provided in normal cascade to the fingers.  Three 0.035 inch K wires were used.  Two were advanced from the ulnar side of the small finger metacarpal head into the ring finger metacarpal and the third advanced proximal to the fracture across the small finger metacarpal into the ring finger metacarpal.  This is adequate stabilize the fracture.  The wrist was placed through tenodesis and again there was improvement of the scissoring.  C-arm was used in AP lateral oblique projections to ensure appropriate reduction position of heart which was the case.  The pins were bent and cut short.  Pin sites were dressed with sterile Xeroform 4 x 4's and wrapped with a Kerlix bandage.  Volar and dorsal slab  splint including the long ring and small fingers was placed with the MPs flexed and the IP is extended.  This was wrapped with Kerlix and Ace bandage.  The tourniquet was deflated at 14 minutes.  Fingertips were pink with brisk capillary refill after deflation of tourniquet.  The operative  drapes were broken down.  The patient was awoken from  anesthesia safely.  He was transferred back to the stretcher and taken to PACU in stable condition.  I will see him back in the office in 1 week for postoperative followup.  I will give him a prescription for Norco 5/325 1-2 tabs PO q6 hours prn pain, dispense # 20.  The prescription drug monitoring program website was unable to process the request.   Leanora Cover, MD Electronically signed, 06/25/19

## 2019-06-25 NOTE — Op Note (Signed)
I assisted Surgeon(s) and Role:    * Betha Loa, MD - Primary    Cindee Salt, MD - Assisting on the Procedure(s): CLOSED REDUCTION METACARPAL WITH PERCUTANEOUS PINNING on 06/25/2019.  I provided assistance on this case as follows: setup, reduction, stabilization, fixation of the fracture and application of the dressings and splint.  Electronically signed by: Cindee Salt, MD Date: 06/25/2019 Time: 1:18 PM

## 2019-06-26 ENCOUNTER — Encounter: Payer: Self-pay | Admitting: *Deleted

## 2019-07-01 ENCOUNTER — Ambulatory Visit (INDEPENDENT_AMBULATORY_CARE_PROVIDER_SITE_OTHER): Payer: BC Managed Care – PPO | Admitting: Psychiatry

## 2019-07-01 ENCOUNTER — Other Ambulatory Visit: Payer: Self-pay

## 2019-07-01 DIAGNOSIS — F341 Dysthymic disorder: Secondary | ICD-10-CM
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
## 2019-07-09 ENCOUNTER — Ambulatory Visit: Payer: BC Managed Care – PPO | Admitting: Psychiatry

## 2019-07-11 ENCOUNTER — Other Ambulatory Visit: Payer: Self-pay

## 2019-07-11 ENCOUNTER — Ambulatory Visit (INDEPENDENT_AMBULATORY_CARE_PROVIDER_SITE_OTHER): Payer: BC Managed Care – PPO | Admitting: Psychiatry

## 2019-07-11 DIAGNOSIS — F341 Dysthymic disorder: Secondary | ICD-10-CM | POA: Diagnosis not present
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
## 2019-07-15 ENCOUNTER — Ambulatory Visit (INDEPENDENT_AMBULATORY_CARE_PROVIDER_SITE_OTHER): Payer: BC Managed Care – PPO | Admitting: Psychiatry

## 2019-07-15 ENCOUNTER — Other Ambulatory Visit: Payer: Self-pay

## 2019-07-15 DIAGNOSIS — F341 Dysthymic disorder: Secondary | ICD-10-CM | POA: Diagnosis not present

## 2019-07-15 NOTE — Progress Notes (Signed)
[facility] Counselor/Therapist Progress Note  Patient ID: Kevin Shepherd, MRN: [id number],    Date: 07/15/2019  Time Spent: 45 minutes start time 9:25 AM end time 9:10 AM  Treatment Type: Individual Therapy  Reported Symptoms: sadness, anxiety, frustration  Mental Status Exam:  Appearance:   Casual and Neat     Behavior:  Sharing  Motor:  Normal  Speech/Language:   Normal Rate  Affect:  Depressed  Mood:  anxious and sad  Thought process:  normal  Thought content:    Rumination  Sensory/Perceptual disturbances:    WNL  Orientation:  oriented to person, place, time/date and situation  Attention:  Good  Concentration:  Good  Memory:  WNL  Fund of knowledge:   Fair  Insight:    Fair  Judgment:   Fair  Impulse Control:  Good   Risk Assessment: Danger to Self:  No Self-injurious Behavior: No Danger to Others: No Duty to Warn:no Physical Aggression / Violence:No  Access to Firearms a concern: No  Gang Involvement:No   Subjective: Patient was present for session.  Patient had mother attend session as well.  They reported that things seem to be moving in the direction that patient will go to [place].  Patient reported he is still very frustrated about that possibility.  He wants to be able to stay in the area but does not feel he has the support from his family to be able to do that.  Mother acknowledged that she is not able to have him come and live with her at this point and that it may not be healthy for him to stay with his grandmother eat either.  She shared she is hopeful that they can resolve things while he is in [place].  She is anticipating that it will be temporary for him to be there.  The hope is that he will just get healed and then be able to find a job here which he can come back and start.  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   Mother reported being supportive of those options but wanted him to have a stable income so that he can take care of himself.  Patient was able to acknowledge understanding her wanting him to be able to have needs met.  Discussed the importance of him trying to focus more on what things can be like when he reads turns rather than being frustrated because he will be going there.  He reported hoping that he will have a quiet space when he is there to allow him time to think and debrief.  Mother reported having the same thing but since he will be staying with his dad's family she cannot guarantee that.  Patient agreed to contact his father to let him know that he is planning on going to [place] at this time.  Mother and patient will be meeting with [facility] while patient is in [place].  Interventions: Solution-Oriented/Positive Psychology  Diagnosis:   ICD-10-CM   1. Persistent depressive disorder with atypical features, currently moderate  F34.1     Plan: Patient is to utilize CBT and coping skills to manage depression symptoms.  Patient is to go to [facility] and stay with his cousin to give him time to work on healing and getting back to [place].  Patient and mother are to work  on some of their differences in treatment while he is in [place]. Long-term goal: Develop healthy cognitive patterns and beliefs about self in the world that lead to alleviation and help prevent the relapse of depression symptoms Short-term goal: Express feelings of hurt disappointment shame and anger that are associated with early life experiences   Lina Sayre, [facility]

## 2019-07-25 ENCOUNTER — Ambulatory Visit (INDEPENDENT_AMBULATORY_CARE_PROVIDER_SITE_OTHER): Payer: BC Managed Care – PPO | Admitting: Psychiatry

## 2019-07-25 ENCOUNTER — Other Ambulatory Visit: Payer: Self-pay

## 2019-07-25 DIAGNOSIS — F341 Dysthymic disorder: Secondary | ICD-10-CM | POA: Diagnosis not present

## 2019-07-25 NOTE — Progress Notes (Signed)
      [facility] Counselor/Therapist Progress Note  Patient ID: Akshat Minehart, MRN: [id number],    Date: 07/25/2019  Time Spent: 34 minutes 1:31 PM end time 2:05  Treatment Type: Individual Therapy  Reported Symptoms: anxiety, sadness, frustration  Mental Status Exam:  Appearance:   Casual and Neat     Behavior:  Appropriate  Motor:  Normal  Speech/Language:   Normal Rate  Affect:  Appropriate  Mood:  normal  Thought process:  normal  Thought content:    WNL  Sensory/Perceptual disturbances:    WNL  Orientation:  oriented to person, place, time/date and situation  Attention:  Good  Concentration:  Good  Memory:  WNL  Fund of knowledge:   Good  Insight:    Good  Judgment:   Good  Impulse Control:  Good   Risk Assessment: Danger to Self:  No Self-injurious Behavior: No Danger to Others: No Duty to Warn:no Physical Aggression / Violence:No  Access to Firearms a concern: No  Gang Involvement:No   Subjective: Patient was present for session.  He shared that things are still up in the air.  He explained that his dad is going to help him with a car.  He went on to explain that his mother is not wanting to help him if his dad gets involved but it has been hard for him to figure out what is next.  Helped patient think through the steps he needs to take at this time.  Expressed to peace about going to [facility] and recognize that that is probably a positive step in his journey.  Patient was encouraged to communicate that with his mother and grandmother and to talk about trying to wait until after he gets his pins out of his wrist next week before he tries to make the move.  Patient explained that he really wants to be able to take his dog who is a great support with him on his trip.  He was encouraged to ask them who he could stay with that might allow his dog as well.  Patient was encouraged to come up with a second plan for his dog if he is unable to take her.  He reported that  he would be willing to take her to [facility] or somewhere where he would know that she would be able to find a good home to take care of her.  Patient was encouraged to feel positive about the plan that he is developed and to stay focused on trying to carry that out so he can start moving forward with his life.  Interventions: Solution-Oriented/Positive Psychology  Diagnosis:   ICD-10-CM   1. Persistent depressive disorder with atypical features, currently moderate  F34.1     Plan: Patient is to use coping skills to decrease depression symptoms.  Patient is to follow through with plan from session to communicate with his mother and grandmother and start figuring out how to get to [facility] to start the next chapter of his life. Long-term goal: Develop healthy cognitive patterns and beliefs about self in the world that lead to alleviation and help prevent the relapse of depression symptoms Short-term goal: Express feelings of hurt disappointment shame and anger that are associated with early life experiences  Stevphen Meuse, [facility]

## 2019-08-02 ENCOUNTER — Other Ambulatory Visit: Payer: Self-pay

## 2019-08-02 ENCOUNTER — Ambulatory Visit (INDEPENDENT_AMBULATORY_CARE_PROVIDER_SITE_OTHER): Payer: BC Managed Care – PPO | Admitting: Psychiatry

## 2019-08-02 DIAGNOSIS — F341 Dysthymic disorder: Secondary | ICD-10-CM | POA: Diagnosis not present

## 2019-08-02 NOTE — Progress Notes (Signed)
      [facility] Counselor/Therapist Progress Note  Patient ID: Kevin Shepherd, MRN: [id number],    Date: 08/02/2019  Time Spent: 52 minutes start time 2:04 PM end time 2:54 PM  Treatment Type: Individual Therapy  Reported Symptoms: stress, sadness  Mental Status Exam:  Appearance:   Casual and Neat     Behavior:  Appropriate  Motor:  Normal  Speech/Language:   Normal Rate  Affect:  Appropriate  Mood:  sad  Thought process:  normal  Thought content:    WNL  Sensory/Perceptual disturbances:    WNL  Orientation:  oriented to person, place, time/date and situation  Attention:  Good  Concentration:  Good  Memory:  WNL  Fund of knowledge:   Good  Insight:    Good  Judgment:   Good  Impulse Control:  Good   Risk Assessment: Danger to Self:  No Self-injurious Behavior: No Danger to Others: No Duty to Warn:no Physical Aggression / Violence:No  Access to Firearms a concern: No  Gang Involvement:No   Subjective: Patient was present for session.  He reported that things are not going well because his mom isn't working with him anymore.  He explained that his father will not talk to his mother and his mother will not help him until his father communicates with her.  Patient stated he told her the plan and she did not seem to be interested even though the plan was more of what she had wanted originally.  Patient went on to share that he notices things seem to go okay with his mother until his stepfather gets involved and then she shuts down or gets upset.  Patient stated this is been a problem the whole time that he has been growing up.  Did EMDR set on his mom, suds level 10, negative cognition "I am not important to her" felt anger in his chest.  Patient was able to reduce suds level to 4.  He was able to realize that she has been at a weird place.  He explained that currently she is not working and her work has been off and on as he has been growing up so that may be why she has  a harder time standing up to [place].  He was also able to realize that he just needs to keep communicating with her and his dad to see what he can figure out so he can move forward with his plan to move to [place] with family.  Interventions: Solution-Oriented/Positive Psychology and Eye Movement Desensitization and Reprocessing (EMDR)  Diagnosis:   ICD-10-CM   1. Persistent depressive disorder with atypical features, currently moderate  F34.1     Plan: Patient is to use CBT and coping skills to decrease depression symptoms.  Patient is to continue communicating with his mother and father to figure out what is going to happen concerning his move to [place]. Long-term goal: Develop healthy cognitive patterns and beliefs about self in the world that lead to alleviation and help prevent the relapse of depression symptoms Short-term goal: Express feelings of hurt disappointment shame and anger that are associated with early life experiences  Stevphen Meuse, [facility]

## 2019-08-06 ENCOUNTER — Telehealth: Payer: Self-pay | Admitting: Psychiatry

## 2019-08-06 NOTE — Telephone Encounter (Signed)
Patients father Blain Hunsucker called and would like a call back from [place]. Please call father back at [phone]
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
## 2019-08-07 ENCOUNTER — Telehealth: Payer: Self-pay | Admitting: Psychiatry

## 2019-08-07 NOTE — Telephone Encounter (Signed)
Patient's mother wanted to talk briefly with clinician.  She expressed concern for her son.  She shared that she feels he is having delusional and manic behavior.  She reported she did not want him attend family therapy because it would not be helpful.  Explained to mother that when only pieces of a puzzle are given from different people it is hard to truly help the patient that her presents even though it may be difficult could be helpful in making sure that everybody is on the same page and that things are moving in a positive direction for patient.  Patient's mother agree that she should come to session with patient

## 2019-08-08 ENCOUNTER — Other Ambulatory Visit: Payer: Self-pay

## 2019-08-08 ENCOUNTER — Ambulatory Visit: Payer: BC Managed Care – PPO | Admitting: Mental Health

## 2019-08-09 ENCOUNTER — Telehealth: Payer: Self-pay | Admitting: Psychiatry

## 2019-08-09 NOTE — Telephone Encounter (Signed)
Called to try and touch base with patient since he did not attend session with Elio Forget [facility] as planned.  Left message letting him know that there was concern about him not attending the session.

## 2019-08-09 NOTE — Telephone Encounter (Signed)
[facility] called and LVM on 08/09/2019 and wanted to let you know he has made it to [facility]

## 2019-08-09 NOTE — Telephone Encounter (Signed)
Left message for patient checking on him.

## 2019-08-12 ENCOUNTER — Telehealth: Payer: Self-pay | Admitting: Psychiatry

## 2019-08-12 NOTE — Telephone Encounter (Signed)
Patient left voicemail message sharing that he had gotten to [place].

## 2019-12-25 ENCOUNTER — Encounter: Payer: Self-pay | Admitting: Psychiatry

## 2020-12-25 IMAGING — CR DG HAND 2V*R*
2 series · 2 of 2 positions shown · non-contrast
Comparison: None.

CLINICAL DATA: Pain after hitting wall

EXAM:
RIGHT HAND - 2 VIEW

[x hand ap right]
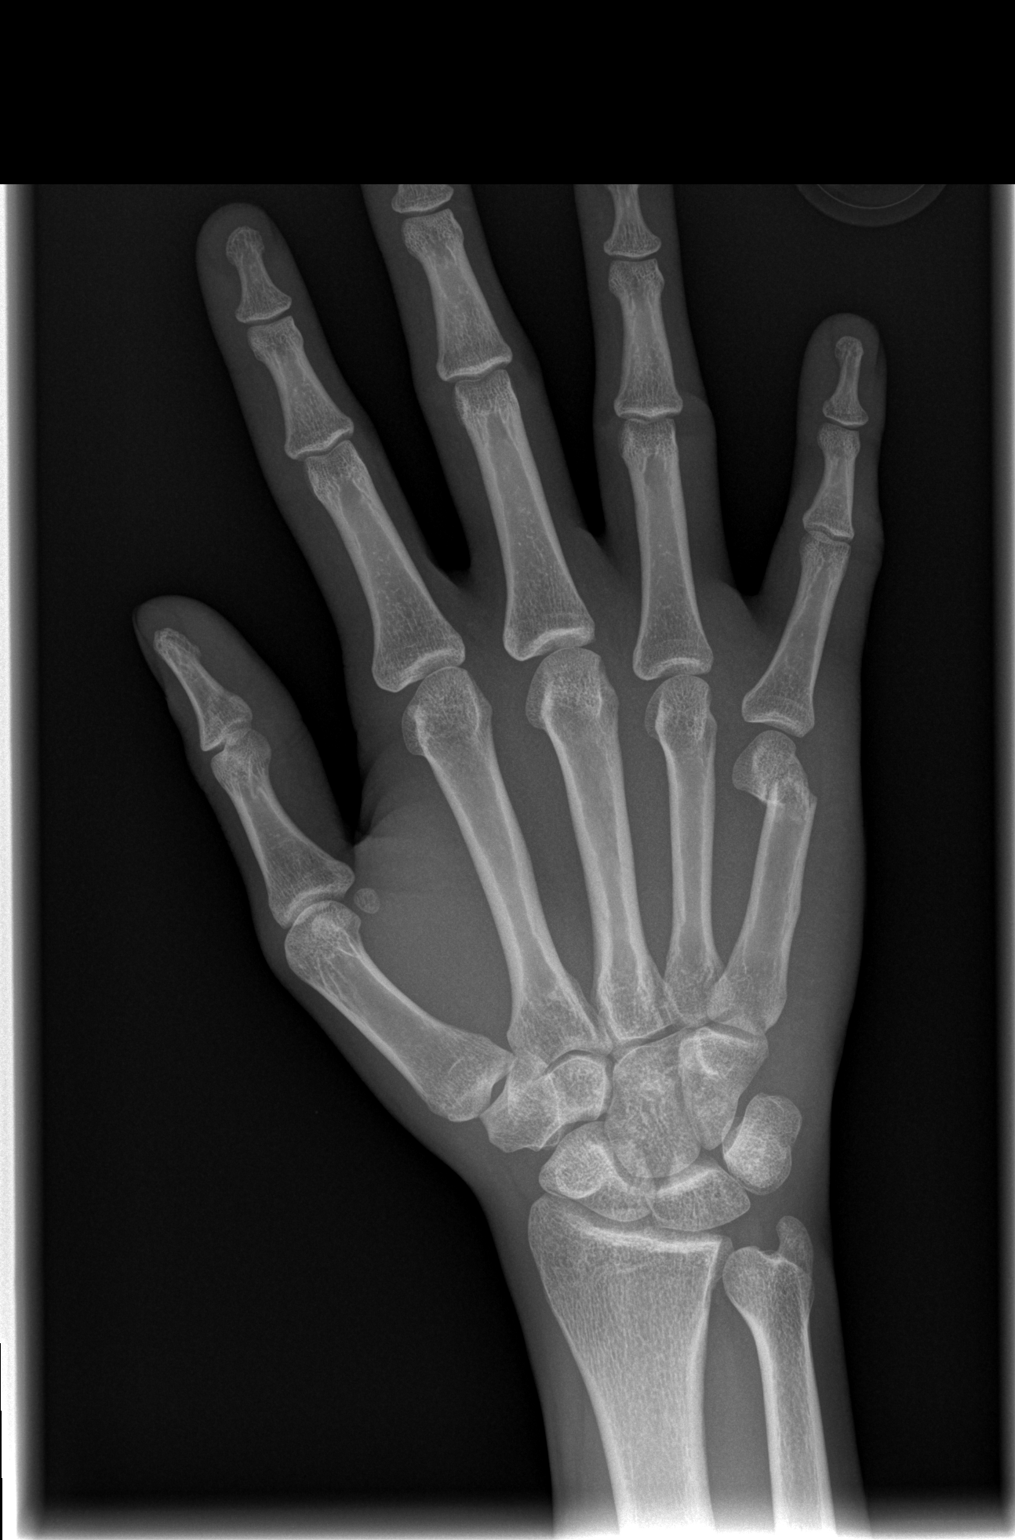

[x hand lat right]
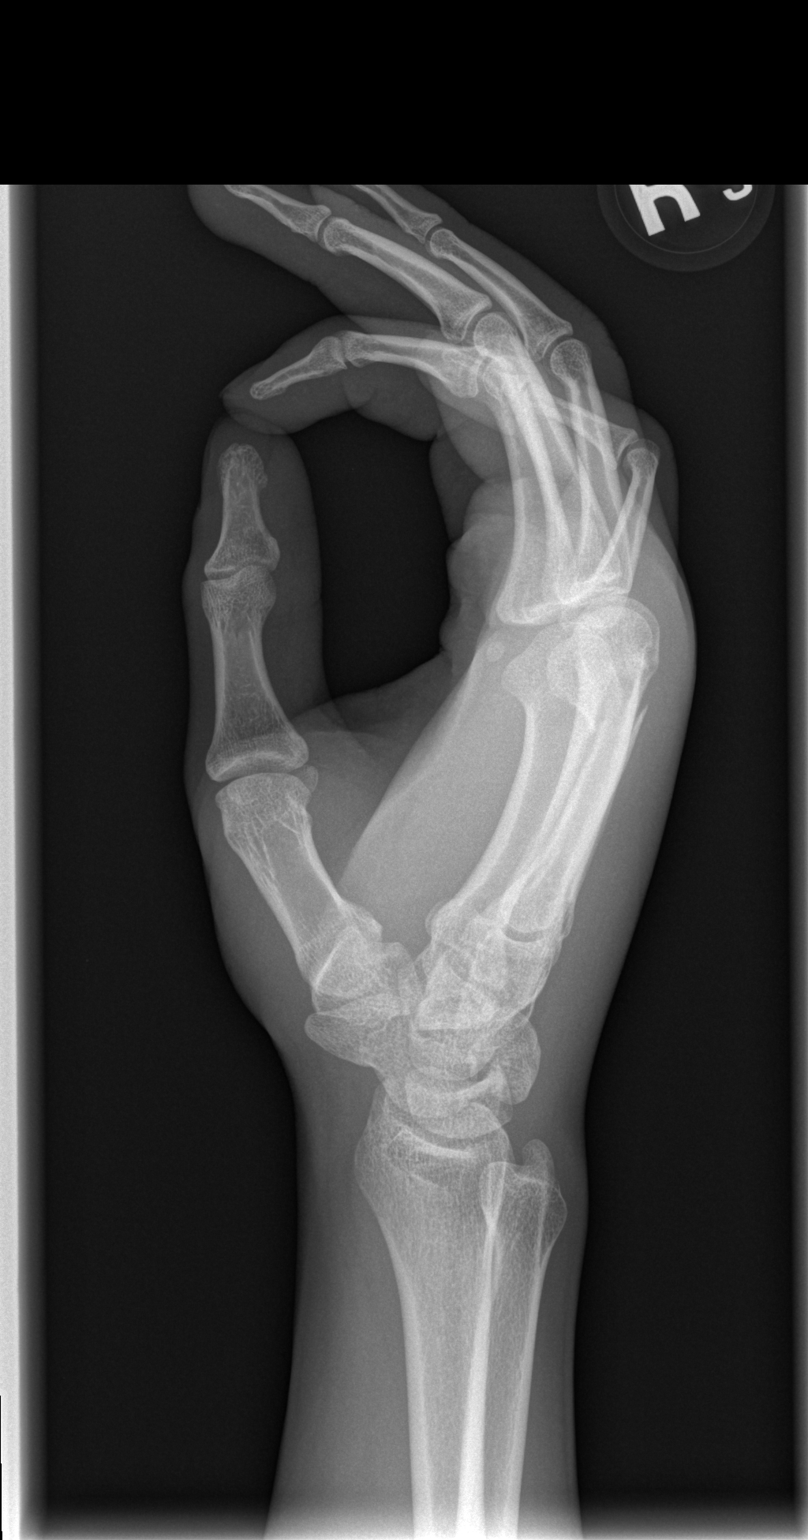

[2 of 2 positions shown; findings below may reference images not displayed]

FINDINGS: Frontal and lateral views obtained. There is a comminuted fracture
of the distal fifth metacarpal with volar angulation and
displacement distally. No other fracture. No dislocation. No
appreciable joint space narrowing or erosion.
IMPRESSION: Comminuted fracture distal fifth metacarpal with dorsal angulation
and displacement distally. No other fracture. No dislocation. No
evident arthropathy.

These results will be called to the ordering clinician or
representative by the Radiologist Assistant, and communication
documented in the PACS or [REDACTED].

## 2021-02-19 ENCOUNTER — Telehealth: Payer: Self-pay | Admitting: *Deleted

## 2021-02-19 NOTE — Telephone Encounter (Signed)
Received call from pt mom asking if MD would take son as a new patient. Dr. Macario Golds see is father [name]. She states he is not on any medications he prefers organic/natural medicines, but he need to have a PCP. Inform mom will send MD note will call back w/ his response.Marland KitchenRaechel Chute

## 2021-02-21 NOTE — Telephone Encounter (Signed)
Yes pls Thx 

## 2021-02-22 NOTE — Telephone Encounter (Signed)
Notified pt mom w/ MD response. Will have son to call to scheduled appt...[name]

## 2023-08-21 ENCOUNTER — Encounter (HOSPITAL_COMMUNITY): Payer: Self-pay

## 2023-08-21 ENCOUNTER — Emergency Department (HOSPITAL_COMMUNITY)
Admission: EM | Admit: 2023-08-21 | Discharge: 2023-08-21 | Attending: Emergency Medicine | Admitting: Emergency Medicine

## 2023-08-21 ENCOUNTER — Emergency Department (HOSPITAL_COMMUNITY)

## 2023-08-21 ENCOUNTER — Other Ambulatory Visit: Payer: Self-pay

## 2023-08-21 DIAGNOSIS — S00211A Abrasion of right eyelid and periocular area, initial encounter: Secondary | ICD-10-CM | POA: Insufficient documentation

## 2023-08-21 DIAGNOSIS — S80212A Abrasion, left knee, initial encounter: Secondary | ICD-10-CM | POA: Insufficient documentation

## 2023-08-21 DIAGNOSIS — S0990XA Unspecified injury of head, initial encounter: Secondary | ICD-10-CM

## 2023-08-21 DIAGNOSIS — W1830XA Fall on same level, unspecified, initial encounter: Secondary | ICD-10-CM | POA: Diagnosis not present

## 2023-08-21 DIAGNOSIS — W19XXXA Unspecified fall, initial encounter: Secondary | ICD-10-CM

## 2023-08-21 DIAGNOSIS — M25562 Pain in left knee: Secondary | ICD-10-CM | POA: Diagnosis present

## 2023-08-21 HISTORY — DX: Unspecified asthma, uncomplicated: J45.909

## 2023-08-21 MED ORDER — BACITRACIN ZINC 500 UNIT/GM EX OINT
TOPICAL_OINTMENT | CUTANEOUS | Status: AC
  Filled 2023-08-21: qty 0.9
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
## 2023-08-21 NOTE — Discharge Instructions (Addendum)
 Is was a pleasure taking care of you today.  Based on your history, physical exam, and mechanism of injury I do not see a need for imaging of your head today.  The x-ray of your left knee is negative for fracture or acute injury.  You have a minor abrasion to your left knee as well as a small abrasion to your right forehead above your eyebrow.  These wounds have been cleaned and dressed by our nursing staff.  Please continue supportive care at home including keeping the wounds clean and dry.  Please return the emergency department if these wounds begin to show signs of infection including redness, drainage, swelling, pain.  Please continue to monitor symptoms at home including fever/chills.  Please return the emergency department or seek further medical care if you experience any of the following symptoms include but not limited to severe headache, visual disturbances, trouble walking, weakness, dizziness, severe pain, issues with range of motion of left knee.
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
## 2023-09-13 ENCOUNTER — Other Ambulatory Visit: Payer: Self-pay

## 2023-09-13 ENCOUNTER — Emergency Department (HOSPITAL_BASED_OUTPATIENT_CLINIC_OR_DEPARTMENT_OTHER)
Admission: EM | Admit: 2023-09-13 | Discharge: 2023-09-13 | Disposition: A | Discharge status: Home / Self Care | Attending: Emergency Medicine | Admitting: Emergency Medicine

## 2023-09-13 ENCOUNTER — Encounter (HOSPITAL_BASED_OUTPATIENT_CLINIC_OR_DEPARTMENT_OTHER): Payer: Self-pay

## 2023-09-13 ENCOUNTER — Emergency Department (HOSPITAL_BASED_OUTPATIENT_CLINIC_OR_DEPARTMENT_OTHER): Admitting: Radiology

## 2023-09-13 DIAGNOSIS — J45909 Unspecified asthma, uncomplicated: Secondary | ICD-10-CM | POA: Insufficient documentation

## 2023-09-13 DIAGNOSIS — J069 Acute upper respiratory infection, unspecified: Secondary | ICD-10-CM | POA: Insufficient documentation

## 2023-09-13 DIAGNOSIS — R059 Cough, unspecified: Secondary | ICD-10-CM | POA: Diagnosis present

## 2023-09-13 LAB — RESP PANEL BY RT-PCR (RSV, FLU A&B, COVID)  RVPGX2
Influenza A by PCR: NEGATIVE
Influenza B by PCR: NEGATIVE
Resp Syncytial Virus by PCR: NEGATIVE
SARS Coronavirus 2 by RT PCR: NEGATIVE

## 2023-09-13 MED ORDER — ACETAMINOPHEN 500 MG PO TABS
1000.0000 mg | ORAL_TABLET | Freq: Once | ORAL | Status: AC
  Administered 2023-09-13: 1000 mg via ORAL
  Filled 2023-09-13: qty 2

## 2023-09-13 MED ORDER — MOMETASONE FUROATE 50 MCG/ACT NA SUSP: 2.0000 | Freq: Every day | NASAL | 0 refills | Status: AC

## 2023-09-13 MED ORDER — LORATADINE 10 MG PO TABS
10.0000 mg | ORAL_TABLET | Freq: Once | ORAL | Status: AC
  Administered 2023-09-13: 10 mg via ORAL
  Filled 2023-09-13: qty 1

## 2023-09-13 NOTE — Discharge Instructions (Signed)
 Alternate tylenol  and ibuprofen  for pain and fever

## 2023-09-13 NOTE — ED Provider Notes (Signed)
 [place] EMERGENCY DEPARTMENT AT [facility] Provider Note   CSN: [id number] Arrival date & time: 09/13/23  0023     Patient presents with: Fever and Chest Pain   [facility] Kevin Shepherd is a 23 y.o. male.   The history is provided by the patient.  Fever Temp source:  Subjective Severity:  Moderate Onset quality:  Gradual Timing:  Constant Progression:  Unchanged Associated symptoms: congestion, cough and sore throat   Risk factors: no recent sickness and no recent travel   Patient with bipolar disorder who is here with URI symptoms x 1day.  He has pain in on the right with coughing also has congestion and body aches.  No travel, no leg pain.  No SOB.      Past Medical History:  Diagnosis Date   Anxiety    Asthma    Bipolar disorder (HCC)      Prior to Admission medications   Medication Sig Start Date End Date Taking? Authorizing Provider  mometasone  (NASONEX ) 50 MCG/ACT nasal spray Place 2 sprays into the nose daily. 09/13/23  Yes Ladana Chavero, MD  HYDROcodone -acetaminophen  [facility]) 5-325 MG tablet 1-2 tabs po q6 hours prn pain 06/25/19   Kuzma, Kevin, MD  hydrOXYzine  (ATARAX /VISTARIL ) 25 MG tablet Take 1 tablet (25 mg total) by mouth 3 (three) times daily as needed for anxiety. 06/16/19   Collene Gouge I, NP  ibuprofen  (ADVIL ,MOTRIN ) 800 MG tablet Take 1 tablet (800 mg total) by mouth 3 (three) times daily. 04/29/16   Jarold Olam HERO, PA-C  OLANZapine  zydis (ZYPREXA ) 10 MG disintegrating tablet Take 1 tablet (10 mg total) by mouth at bedtime. For mood control 06/16/19   Collene Gouge I, NP  OXcarbazepine  (TRILEPTAL ) 150 MG tablet Take 0.5 tablets (75 mg total) by mouth 2 (two) times daily. For mood stabilization 06/16/19   Collene Gouge I, NP  traZODone  (DESYREL ) 100 MG tablet Take 2 tablets (200 mg total) by mouth at bedtime. For sleep 06/16/19   Collene Gouge I, NP    Allergies: Patient has no known allergies.    Review of Systems  HENT:  Positive for congestion and  sore throat.   Respiratory:  Positive for cough.   Gastrointestinal:  Negative for diarrhea.  Musculoskeletal:  Positive for myalgias.    Updated Vital Signs BP (!) 126/99 (BP Location: Right Arm)   Pulse (!) 126   Temp 99.3 F (37.4 C) (Oral)   Resp 18   SpO2 99%   Physical Exam  (all labs ordered are listed, but only abnormal results are displayed) Labs Reviewed  RESP PANEL BY RT-PCR (RSV, FLU A&B, COVID)  [id number]    EKG: EKG Interpretation Date/Time:  Wednesday September 13 2023 00:35:24 EDT Ventricular Rate:  108 PR Interval:  130 QRS Duration:  84 QT Interval:  302 QTC Calculation: 404 R Axis:   64  Text Interpretation: Sinus tachycardia Confirmed by Aariyana Manz ([id number]) on 09/13/2023 1:18:59 AM  Radiology: [facility] Chest 2 View Result Date: 09/13/2023 CLINICAL DATA:  Chest pain, sore throat, fever, and congestion. Right-sided chest pain. EXAM: CHEST - 2 VIEW COMPARISON:  04/28/2016 FINDINGS: The heart size and mediastinal contours are within normal limits. Both lungs are clear. The visualized skeletal structures are unremarkable. IMPRESSION: No active cardiopulmonary disease. Electronically Signed   By: Elsie Gravely M.D.   On: 09/13/2023 00:47     Procedures   Medications Ordered in the ED  acetaminophen  (TYLENOL ) tablet 1,000 mg (has no administration in time range)  loratadine  (CLARITIN ) tablet 10 mg (has no administration in time range)                                    Medical Decision Making Patient with URI symptoms   Amount and/or Complexity of Data Reviewed External Data Reviewed: notes.    Details: Previous notes reviewed  Labs: ordered.    Details: Negative covid and flu  Radiology: ordered and independent interpretation performed.    Details: Negative CXR ECG/medicine tests: ordered and independent interpretation performed. Decision-making details documented in ED Course.  Risk OTC drugs. Risk Details: negative [place] 0.  He has pain with  coughing and body aches.  No covid or flu.  Very well appearing with normal vitals upon entrance to the room.  Nasonex  for congestion tylenol  and ibuprofen  for pain or fever.  Stable for discharge strict returns.        Final diagnoses:  Upper respiratory tract infection, unspecified type   No signs of systemic illness or infection. The patient is nontoxic-appearing on exam and vital signs are within normal limits.  I have reviewed the triage vital signs and the nursing notes. Pertinent labs & imaging results that were available during my care of the patient were reviewed by me and considered in my medical decision making (see chart for details). After history, exam, and medical workup I feel the patient has been appropriately medically screened and is safe for discharge home. Pertinent diagnoses were discussed with the patient. Patient was given return precautions.    ED Discharge Orders          Ordered    mometasone  (NASONEX ) 50 MCG/ACT nasal spray  Daily        09/13/23 0126               Layken Beg, MD 09/13/23 0207

## 2023-09-13 NOTE — ED Triage Notes (Signed)
 Sore throat, feeling feverish and congestion since this morning. Mild R sided chest pain.Took Advil  one hour before arrival.
